# Patient Record
Sex: Male | Born: 1955 | Race: Black or African American | Hispanic: No | Marital: Married | Smoking: Former smoker
Health system: Southern US, Community
[De-identification: ages and names within clinical notes are randomized; demographics above are authoritative.]

## PROBLEM LIST (undated history)

## (undated) DIAGNOSIS — D689 Coagulation defect, unspecified: Secondary | ICD-10-CM

## (undated) DIAGNOSIS — N189 Chronic kidney disease, unspecified: Secondary | ICD-10-CM

## (undated) DIAGNOSIS — I809 Phlebitis and thrombophlebitis of unspecified site: Secondary | ICD-10-CM

## (undated) DIAGNOSIS — E785 Hyperlipidemia, unspecified: Secondary | ICD-10-CM

## (undated) DIAGNOSIS — K635 Polyp of colon: Secondary | ICD-10-CM

## (undated) DIAGNOSIS — Z86718 Personal history of other venous thrombosis and embolism: Secondary | ICD-10-CM

## (undated) DIAGNOSIS — F1010 Alcohol abuse, uncomplicated: Secondary | ICD-10-CM

## (undated) DIAGNOSIS — I10 Essential (primary) hypertension: Secondary | ICD-10-CM

## (undated) HISTORY — DX: Polyp of colon: K63.5

## (undated) HISTORY — DX: Hyperlipidemia, unspecified: E78.5

## (undated) HISTORY — DX: Phlebitis and thrombophlebitis of unspecified site: I80.9

## (undated) HISTORY — DX: Alcohol abuse, uncomplicated: F10.10

## (undated) HISTORY — DX: Personal history of other venous thrombosis and embolism: Z86.718

## (undated) HISTORY — DX: Chronic kidney disease, unspecified: N18.9

## (undated) HISTORY — DX: Coagulation defect, unspecified: D68.9

## (undated) HISTORY — DX: Essential (primary) hypertension: I10

---

## 2000-10-10 HISTORY — PX: TRACHEAL ESOPHAGEAL PUNCTURE REPAIR: SHX5218

## 2000-10-10 HISTORY — PX: LEG SURGERY: SHX1003

## 2001-07-18 ENCOUNTER — Encounter: Admission: RE | Admit: 2001-07-18 | Discharge: 2001-08-09 | Payer: Self-pay | Admitting: *Deleted

## 2015-09-01 ENCOUNTER — Ambulatory Visit (INDEPENDENT_AMBULATORY_CARE_PROVIDER_SITE_OTHER): Payer: No Typology Code available for payment source | Admitting: Internal Medicine

## 2015-09-01 ENCOUNTER — Encounter: Payer: Self-pay | Admitting: Internal Medicine

## 2015-09-01 ENCOUNTER — Ambulatory Visit: Payer: Self-pay | Admitting: Internal Medicine

## 2015-09-01 ENCOUNTER — Other Ambulatory Visit (INDEPENDENT_AMBULATORY_CARE_PROVIDER_SITE_OTHER): Payer: No Typology Code available for payment source

## 2015-09-01 VITALS — BP 134/80 | HR 85 | Temp 98.0°F | Resp 12 | Ht 70.0 in | Wt 198.1 lb

## 2015-09-01 DIAGNOSIS — I10 Essential (primary) hypertension: Secondary | ICD-10-CM

## 2015-09-01 DIAGNOSIS — M1A9XX0 Chronic gout, unspecified, without tophus (tophi): Secondary | ICD-10-CM

## 2015-09-01 DIAGNOSIS — Z23 Encounter for immunization: Secondary | ICD-10-CM | POA: Diagnosis not present

## 2015-09-01 DIAGNOSIS — E213 Hyperparathyroidism, unspecified: Secondary | ICD-10-CM

## 2015-09-01 DIAGNOSIS — E785 Hyperlipidemia, unspecified: Secondary | ICD-10-CM | POA: Insufficient documentation

## 2015-09-01 DIAGNOSIS — Z86718 Personal history of other venous thrombosis and embolism: Secondary | ICD-10-CM | POA: Insufficient documentation

## 2015-09-01 DIAGNOSIS — M792 Neuralgia and neuritis, unspecified: Secondary | ICD-10-CM

## 2015-09-01 LAB — COMPREHENSIVE METABOLIC PANEL
ALT: 11 U/L (ref 0–53)
AST: 21 U/L (ref 0–37)
Albumin: 4.3 g/dL (ref 3.5–5.2)
Alkaline Phosphatase: 107 U/L (ref 39–117)
BUN: 9 mg/dL (ref 6–23)
CO2: 29 mEq/L (ref 19–32)
Calcium: 9.5 mg/dL (ref 8.4–10.5)
Chloride: 103 mEq/L (ref 96–112)
Creatinine, Ser: 1.16 mg/dL (ref 0.40–1.50)
GFR: 68.45 mL/min (ref >60.00)
Glucose, Bld: 72 mg/dL (ref 70–99)
Potassium: 3.7 mEq/L (ref 3.5–5.1)
Sodium: 139 mEq/L (ref 135–145)
Total Bilirubin: 0.5 mg/dL (ref 0.2–1.2)
Total Protein: 8.7 g/dL — ABNORMAL HIGH (ref 6.0–8.3)

## 2015-09-01 LAB — URIC ACID: Uric Acid, Serum: 8.7 mg/dL — ABNORMAL HIGH (ref 4.0–7.8)

## 2015-09-01 NOTE — Assessment & Plan Note (Signed)
Reviewed recent lipid panel from 2/16 and does not need repeat today. Checking LFTs and adjust as needed. No symptoms on pravastatin 40 mg daily.
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
## 2015-09-01 NOTE — Patient Instructions (Signed)
We are going to check some blood work today to see if we think you need the calcitriol.   When you run out of the xarelto DO NOT refill it. We will stop it. To decrease your future risk of blood clots stop smoking, make sure to take breaks to get up and walk while on car rides.   You can stop taking the colchicine daily and keep it for when you have gout flares only.   Keep taking the amlodipine, allopurinol, gabapentin, duloxetine, pravastatin. Keep using tramadol as needed for pain.   Come back in about 6 months for a check up, if you have any problems or questions sooner please call us.   Smoking Cessation, Tips for Success If you are ready to quit smoking, congratulations! You have chosen to help yourself be healthier. Cigarettes bring nicotine, tar, carbon monoxide, and other irritants into your body. Your lungs, heart, and blood vessels will be able to work better without these poisons. There are many different ways to quit smoking. Nicotine gum, nicotine patches, a nicotine inhaler, or nicotine nasal spray can help with physical craving. Hypnosis, support groups, and medicines help break the habit of smoking. WHAT THINGS CAN I DO TO MAKE QUITTING EASIER?  Here are some tips to help you quit for good:  Pick a date when you will quit smoking completely. Tell all of your friends and family about your plan to quit on that date.  Do not try to slowly cut down on the number of cigarettes you are smoking. Pick a quit date and quit smoking completely starting on that day.  Throw away all cigarettes.   Clean and remove all ashtrays from your home, work, and car.  On a card, write down your reasons for quitting. Carry the card with you and read it when you get the urge to smoke.  Cleanse your body of nicotine. Drink enough water and fluids to keep your urine clear or pale yellow. Do this after quitting to flush the nicotine from your body.  Learn to predict your moods. Do not let a bad  situation be your excuse to have a cigarette. Some situations in your life might tempt you into wanting a cigarette.  Never have "just one" cigarette. It leads to wanting another and another. Remind yourself of your decision to quit.  Change habits associated with smoking. If you smoked while driving or when feeling stressed, try other activities to replace smoking. Stand up when drinking your coffee. Brush your teeth after eating. Sit in a different chair when you read the paper. Avoid alcohol while trying to quit, and try to drink fewer caffeinated beverages. Alcohol and caffeine may urge you to smoke.  Avoid foods and drinks that can trigger a desire to smoke, such as sugary or spicy foods and alcohol.  Ask people who smoke not to smoke around you.  Have something planned to do right after eating or having a cup of coffee. For example, plan to take a walk or exercise.  Try a relaxation exercise to calm you down and decrease your stress. Remember, you may be tense and nervous for the first 2 weeks after you quit, but this will pass.  Find new activities to keep your hands busy. Play with a pen, coin, or rubber band. Doodle or draw things on paper.  Brush your teeth right after eating. This will help cut down on the craving for the taste of tobacco after meals. You can also try mouthwash.  Use oral substitutes in place of cigarettes. Try using lemon drops, carrots, cinnamon sticks, or chewing gum. Keep them handy so they are available when you have the urge to smoke.  When you have the urge to smoke, try deep breathing.  Designate your home as a nonsmoking area.  If you are a heavy smoker, ask your health care provider about a prescription for nicotine chewing gum. It can ease your withdrawal from nicotine.  Reward yourself. Set aside the cigarette money you save and buy yourself something nice.  Look for support from others. Join a support group or smoking cessation program. Ask  someone at home or at work to help you with your plan to quit smoking.  Always ask yourself, "Do I need this cigarette or is this just a reflex?" Tell yourself, "Today, I choose not to smoke," or "I do not want to smoke." You are reminding yourself of your decision to quit.  Do not replace cigarette smoking with electronic cigarettes (commonly called e-cigarettes). The safety of e-cigarettes is unknown, and some may contain harmful chemicals.  If you relapse, do not give up! Plan ahead and think about what you will do the next time you get the urge to smoke. HOW WILL I FEEL WHEN I QUIT SMOKING? You may have symptoms of withdrawal because your body is used to nicotine (the addictive substance in cigarettes). You may crave cigarettes, be irritable, feel very hungry, cough often, get headaches, or have difficulty concentrating. The withdrawal symptoms are only temporary. They are strongest when you first quit but will go away within 10-14 days. When withdrawal symptoms occur, stay in control. Think about your reasons for quitting. Remind yourself that these are signs that your body is healing and getting used to being without cigarettes. Remember that withdrawal symptoms are easier to treat than the major diseases that smoking can cause.  Even after the withdrawal is over, expect periodic urges to smoke. However, these cravings are generally short lived and will go away whether you smoke or not. Do not smoke! WHAT RESOURCES ARE AVAILABLE TO HELP ME QUIT SMOKING? Your health care provider can direct you to community resources or hospitals for support, which may include:  Group support.  Education.  Hypnosis.  Therapy.   This information is not intended to replace advice given to you by your health care provider. Make sure you discuss any questions you have with your health care provider.   Document Released: 06/24/2004 Document Revised: 10/17/2014 Document Reviewed: 03/14/2013 Elsevier  Interactive Patient Education [employer]

## 2015-09-01 NOTE — Progress Notes (Signed)
Pre visit review using our clinic review tool, if applicable. No additional management support is needed unless otherwise documented below in the visit note. 
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
## 2015-09-01 NOTE — Assessment & Plan Note (Signed)
Has been on 9 months of therapy for first provoked DVT. Talked to him about the need to stop smoking. Okay to stop xarelto once bottle is done. Reminded them about the risk of future blood clot. If he does have another DVT then he would need lifelong anticoagulation. Unlikely to be primary clotting disorder as no family history.

## 2015-09-01 NOTE — Assessment & Plan Note (Signed)
BP at goal on amlodipine, checking CMP today and adjust as needed. Reviewing his records during visit he has had renal insufficiency in the past with Cr up to 1.7 and unclear if this is why he is on calcitriol or primary hyperparathyroidism.

## 2015-09-01 NOTE — Assessment & Plan Note (Signed)
Will need to review records about this. Checking PTH and calcium today. It is unclear if this was caused by his renal insufficiency or primary. If primary then he needs bone density if he has not had.

## 2015-09-03 LAB — PTH, INTACT AND CALCIUM
Calcium: 9.1 mg/dL (ref 8.4–10.5)
PTH: 11 pg/mL — ABNORMAL LOW (ref 14–64)

## 2015-09-07 ENCOUNTER — Other Ambulatory Visit: Payer: Self-pay | Admitting: Internal Medicine

## 2015-09-07 MED ORDER — ALLOPURINOL 300 MG PO TABS: 300.0000 mg | ORAL_TABLET | Freq: Two times a day (BID) | ORAL | Status: DC

## 2015-11-09 ENCOUNTER — Encounter: Payer: Self-pay | Admitting: Internal Medicine

## 2015-11-09 ENCOUNTER — Other Ambulatory Visit (INDEPENDENT_AMBULATORY_CARE_PROVIDER_SITE_OTHER): Payer: BLUE CROSS/BLUE SHIELD

## 2015-11-09 ENCOUNTER — Ambulatory Visit (INDEPENDENT_AMBULATORY_CARE_PROVIDER_SITE_OTHER): Payer: BLUE CROSS/BLUE SHIELD | Admitting: Internal Medicine

## 2015-11-09 VITALS — BP 130/80 | HR 87 | Temp 97.9°F | Ht 70.0 in | Wt 191.5 lb

## 2015-11-09 DIAGNOSIS — F17210 Nicotine dependence, cigarettes, uncomplicated: Secondary | ICD-10-CM | POA: Insufficient documentation

## 2015-11-09 DIAGNOSIS — M109 Gout, unspecified: Secondary | ICD-10-CM | POA: Diagnosis not present

## 2015-11-09 DIAGNOSIS — E213 Hyperparathyroidism, unspecified: Secondary | ICD-10-CM | POA: Diagnosis not present

## 2015-11-09 DIAGNOSIS — M792 Neuralgia and neuritis, unspecified: Secondary | ICD-10-CM

## 2015-11-09 DIAGNOSIS — Z720 Tobacco use: Secondary | ICD-10-CM

## 2015-11-09 LAB — URIC ACID: Uric Acid, Serum: 4.1 mg/dL (ref 4.0–7.8)

## 2015-11-09 MED ORDER — TRAMADOL HCL 50 MG PO TABS: 50.0000 mg | ORAL_TABLET | Freq: Three times a day (TID) | ORAL | Status: DC

## 2015-11-09 MED ORDER — PRAVASTATIN SODIUM 40 MG PO TABS: 40.0000 mg | ORAL_TABLET | Freq: Every day | ORAL | Status: DC

## 2015-11-09 MED ORDER — AMLODIPINE BESYLATE 10 MG PO TABS: 10.0000 mg | ORAL_TABLET | Freq: Every day | ORAL | Status: DC

## 2015-11-09 NOTE — Progress Notes (Signed)
   Subjective:    Patient ID: Troy Bird, male    DOB: 1956-02-06, 60 y.o.   MRN: [id number]  HPI The patient is a 60 YO man coming in for follow up of his gout. He stopped taking calcitriol per our advice and has noticed no changes. He is having more arthritis pains from the cold weather which is hurting his joints and making them more stiff. He is also taking the allopurinol twice a day without flare. Stopped taking colchicine outside of flare. No flare since last time. Did also stop the xarelto per our advice last visit. No leg swelling or pain in his chest. Has cut way back on smoking.   Review of Systems  Constitutional: Negative for fever, activity change, appetite change, fatigue and unexpected weight change.  HENT: Negative.   Eyes: Negative.   Respiratory: Negative for cough, chest tightness, shortness of breath and wheezing.   Cardiovascular: Negative for chest pain, palpitations and leg swelling.  Gastrointestinal: Negative for nausea, abdominal pain, diarrhea, constipation and abdominal distention.  Musculoskeletal: Positive for arthralgias and gait problem. Negative for myalgias.  Skin: Negative.   Neurological: Negative.   Psychiatric/Behavioral: Negative.       Objective:   Physical Exam  Constitutional: He is oriented to person, place, and time. He appears well-developed and well-nourished.  HENT:  Head: Normocephalic and atraumatic.  Eyes: EOM are normal.  Neck: Normal range of motion.  Cardiovascular: Normal rate and regular rhythm.   Pulmonary/Chest: Effort normal and breath sounds normal. No respiratory distress. He has no wheezes. He has no rales.  Abdominal: Soft. Bowel sounds are normal. He exhibits no distension. There is no tenderness. There is no rebound.  Musculoskeletal: He exhibits no edema.  Right leg does not bend at the knee  Neurological: He is alert and oriented to person, place, and time. Coordination normal.  Skin: Skin is warm and dry.    Psychiatric: He has a normal mood and affect.   Filed Vitals:   11/09/15 0831  BP: 130/80  Pulse: 87  Temp: 97.9 F (36.6 C)  TempSrc: Oral  Height:  (1.778 m)  Weight: 191 lb 8 oz (86.864 kg)  SpO2: 97%      Assessment & Plan:

## 2015-11-09 NOTE — Assessment & Plan Note (Signed)
Suspect this is not an accurate diagnosis. Have stopped calcitriol and rechecking PTH and calcium levels today. If still normal will remove from list.

## 2015-11-09 NOTE — Patient Instructions (Signed)
We have filled out the papers and given you the prescription for the pain medicine.   We are checking your labs today and will call with the results.

## 2015-11-09 NOTE — Assessment & Plan Note (Signed)
Per our advice he has cut back to less than 1/2 pack per day. Previously smoking much more. He will keep cutting back with the goal to quit. Reminded about the risk and harm from cigarette smoke.

## 2015-11-09 NOTE — Progress Notes (Signed)
Pre visit review using our clinic review tool, if applicable. No additional management support is needed unless otherwise documented below in the visit note. 
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
## 2015-11-09 NOTE — Assessment & Plan Note (Signed)
Increased allopurinol to 300 mg BID at last visit. Rechecking uric acid for goal <6. No tophus and no flare since last time. Not taking colchicine today anymore.

## 2015-11-10 LAB — PTH, INTACT AND CALCIUM
Calcium: 7.9 mg/dL — ABNORMAL LOW (ref 8.4–10.5)
PTH: 27 pg/mL (ref 14–64)

## 2016-02-23 ENCOUNTER — Ambulatory Visit: Payer: No Typology Code available for payment source | Admitting: Internal Medicine

## 2016-02-29 ENCOUNTER — Ambulatory Visit (INDEPENDENT_AMBULATORY_CARE_PROVIDER_SITE_OTHER): Payer: BLUE CROSS/BLUE SHIELD | Admitting: Internal Medicine

## 2016-02-29 ENCOUNTER — Encounter: Payer: Self-pay | Admitting: Internal Medicine

## 2016-02-29 VITALS — BP 150/88 | HR 77 | Temp 98.4°F | Resp 18 | Ht 71.0 in | Wt 182.4 lb

## 2016-02-29 DIAGNOSIS — M792 Neuralgia and neuritis, unspecified: Secondary | ICD-10-CM | POA: Diagnosis not present

## 2016-02-29 DIAGNOSIS — I10 Essential (primary) hypertension: Secondary | ICD-10-CM | POA: Diagnosis not present

## 2016-02-29 MED ORDER — DICLOFENAC SODIUM 1 % TD GEL: 2.0000 g | Freq: Four times a day (QID) | TRANSDERMAL | Status: DC

## 2016-02-29 MED ORDER — TRAMADOL HCL 50 MG PO TABS: 50.0000 mg | ORAL_TABLET | Freq: Three times a day (TID) | ORAL | Status: DC

## 2016-02-29 NOTE — Assessment & Plan Note (Signed)
Rx for voltaren and increase tramadol to 120 per month as needed for pain. Encouraged only to use extra when needed.

## 2016-02-29 NOTE — Assessment & Plan Note (Signed)
BP mildly above goal on amlodipine 10 mg daily which he has not taken today yet. Monitor and adjust next visit if needed as his BP is generally at goal. Last BMP without indication for change.

## 2016-02-29 NOTE — Progress Notes (Signed)
Pre visit review using our clinic review tool, if applicable. No additional management support is needed unless otherwise documented below in the visit note. 
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
## 2016-02-29 NOTE — Patient Instructions (Signed)
We have increased the amount of the tramadol so you can take 1 or 2 pills up to 3 times per day for the pain as you need it.   We have sent the refill to your pharmacy.   You are doing great with the weight and I would recommend to stay around this weight.   Keep up the good work with walking and being active.

## 2016-06-22 ENCOUNTER — Encounter: Payer: Self-pay | Admitting: Internal Medicine

## 2016-06-22 ENCOUNTER — Ambulatory Visit (INDEPENDENT_AMBULATORY_CARE_PROVIDER_SITE_OTHER): Payer: BLUE CROSS/BLUE SHIELD | Admitting: Internal Medicine

## 2016-06-22 DIAGNOSIS — M545 Low back pain, unspecified: Secondary | ICD-10-CM | POA: Insufficient documentation

## 2016-06-22 DIAGNOSIS — M5441 Lumbago with sciatica, right side: Secondary | ICD-10-CM | POA: Diagnosis not present

## 2016-06-22 MED ORDER — TRAMADOL HCL 50 MG PO TABS: 50.0000 mg | ORAL_TABLET | Freq: Three times a day (TID) | ORAL | 1 refills | Status: DC

## 2016-06-22 MED ORDER — DICLOFENAC SODIUM 75 MG PO TBEC: 75.0000 mg | DELAYED_RELEASE_TABLET | Freq: Two times a day (BID) | ORAL | 0 refills | Status: DC
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
## 2016-06-22 NOTE — Patient Instructions (Signed)
We have refilled the tramadol and sent it in.  We have also sent in voltaren pills that should help the back and the elbow. Take 1 pill twice a day for the next 1-2 weeks for the pain.   You can use heat or ice if they help with the pain.   Back Injury Prevention Back injuries can be very painful. They can also be difficult to heal. After having one back injury, you are more likely to injure your back again. It is important to learn how to avoid injuring or re-injuring your back. The following tips can help you to prevent a back injury. WHAT SHOULD I KNOW ABOUT PHYSICAL FITNESS?  Exercise for 30 minutes per day on most days of the week or as directed by your health care provider. Make sure to:  Do aerobic exercises, such as walking, jogging, biking, or swimming.  Do exercises that increase balance and strength, such as tai chi and yoga. These can decrease your risk of falling and injuring your back.  Do stretching exercises to help with flexibility.  Try to develop strong abdominal muscles. Your abdominal muscles provide a lot of the support that is needed by your back.  Maintain a healthy weight. This helps to decrease your risk of a back injury. WHAT SHOULD I KNOW ABOUT MY DIET?  Talk with your health care provider about your overall diet. Take supplements and vitamins only as directed by your health care provider.  Talk with your health care provider about how much calcium and vitamin D you need each day. These nutrients help to prevent weakening of the bones (osteoporosis). Osteoporosis can cause broken (fractured) bones, which lead to back pain.  Include good sources of calcium in your diet, such as dairy products, green leafy vegetables, and products that have had calcium added to them (fortified).  Include good sources of vitamin D in your diet, such as milk and foods that are fortified with vitamin D. WHAT SHOULD I KNOW ABOUT MY POSTURE?  Sit up straight and stand up  straight. Avoid leaning forward when you sit or hunching over when you stand.  Choose chairs that have good low-back (lumbar) support.  If you work at a desk, sit close to it so you do not need to lean over. Keep your chin tucked in. Keep your neck drawn back, and keep your elbows bent at a right angle. Your arms should look like the letter "L."  Sit high and close to the steering wheel when you drive. Add a lumbar support to your car seat, if needed.  Avoid sitting or standing in one position for very long. Take breaks to get up, stretch, and walk around at least one time every hour. Take breaks every hour if you are driving for long periods of time.  Sleep on your side with your knees slightly bent, or sleep on your back with a pillow under your knees. Do not lie on the front of your body to sleep. WHAT SHOULD I KNOW ABOUT LIFTING, TWISTING, AND REACHING? Lifting and Heavy Lifting  Avoid heavy lifting, especially repetitive heavy lifting. If you must do heavy lifting:  Stretch before lifting.  Work slowly.  Rest between lifts.  Use a tool such as a cart or a dolly to move objects if one is available.  Make several small trips instead of carrying one heavy load.  Ask for help when you need it, especially when moving big objects.  Follow these steps when lifting:  Stand with your feet shoulder-width apart.  Get as close to the object as you can. Do not try to pick up a heavy object that is far from your body.  Use handles or lifting straps if they are available.  Bend at your knees. Squat down, but keep your heels off the floor.  Keep your shoulders pulled back, your chin tucked in, and your back straight.  Lift the object slowly while you tighten the muscles in your legs, abdomen, and buttocks. Keep the object as close to the center of your body as possible.  Follow these steps when putting down a heavy load:  Stand with your feet shoulder-width apart.  Lower the object  slowly while you tighten the muscles in your legs, abdomen, and buttocks. Keep the object as close to the center of your body as possible.  Keep your shoulders pulled back, your chin tucked in, and your back straight.  Bend at your knees. Squat down, but keep your heels off the floor.  Use handles or lifting straps if they are available. Twisting and Reaching  Avoid lifting heavy objects above your waist.  Do not twist at your waist while you are lifting or carrying a load. If you need to turn, move your feet.  Do not bend over without bending at your knees.  Avoid reaching over your head, across a table, or for an object on a high surface. WHAT ARE SOME OTHER TIPS?  Avoid wet floors and icy ground. Keep sidewalks clear of ice to prevent falls.  Do not sleep on a mattress that is too soft or too hard.  Keep items that are used frequently within easy reach.  Put heavier objects on shelves at waist level, and put lighter objects on lower or higher shelves.  Find ways to decrease your stress, such as exercise, massage, or relaxation techniques. Stress can build up in your muscles. Tense muscles are more vulnerable to injury.  Talk with your health care provider if you feel anxious or depressed. These conditions can make back pain worse.  Wear flat heel shoes with cushioned soles.  Avoid sudden movements.  Use both shoulder straps when carrying a backpack.  Do not use any tobacco products, including cigarettes, chewing tobacco, or electronic cigarettes. If you need help quitting, ask your health care provider.   This information is not intended to replace advice given to you by your health care provider. Make sure you discuss any questions you have with your health care provider.   Document Released: 11/03/2004 Document Revised: 02/10/2015 Document Reviewed: 09/30/2014 Elsevier Interactive Patient Education [employer].

## 2016-06-22 NOTE — Assessment & Plan Note (Signed)
Rx for voltaren pills for 1-2 weeks for the pain. He will continue to use his tramadol as needed as well and heat. Given information about safe lifting techniques and stretches to avoid injury in the future. No indication for imaging today.

## 2016-06-22 NOTE — Progress Notes (Signed)
Pre visit review using our clinic review tool, if applicable. No additional management support is needed unless otherwise documented below in the visit note. 

## 2016-07-30 ENCOUNTER — Other Ambulatory Visit: Payer: Self-pay | Admitting: Internal Medicine

## 2016-08-01 ENCOUNTER — Other Ambulatory Visit: Payer: Self-pay | Admitting: *Deleted

## 2016-08-01 MED ORDER — AMLODIPINE BESYLATE 10 MG PO TABS: 10.0000 mg | ORAL_TABLET | Freq: Every day | ORAL | 0 refills | Status: DC

## 2016-08-01 MED ORDER — DICLOFENAC SODIUM 75 MG PO TBEC: 75.0000 mg | DELAYED_RELEASE_TABLET | Freq: Two times a day (BID) | ORAL | 0 refills | Status: DC

## 2016-08-01 NOTE — Addendum Note (Signed)
Addended by: Deatra JamesBRAND, Beyounce Dickens M on: 08/01/2016 09:31 AM   Modules accepted: Orders

## 2016-08-02 ENCOUNTER — Other Ambulatory Visit: Payer: Self-pay | Admitting: Geriatric Medicine

## 2016-08-02 MED ORDER — AMLODIPINE BESYLATE 10 MG PO TABS: 10.0000 mg | ORAL_TABLET | Freq: Every day | ORAL | 0 refills | Status: DC

## 2016-08-30 ENCOUNTER — Encounter: Payer: BLUE CROSS/BLUE SHIELD | Admitting: Internal Medicine

## 2016-10-04 ENCOUNTER — Ambulatory Visit (INDEPENDENT_AMBULATORY_CARE_PROVIDER_SITE_OTHER): Payer: BLUE CROSS/BLUE SHIELD | Admitting: Internal Medicine

## 2016-10-04 ENCOUNTER — Encounter: Payer: Self-pay | Admitting: Internal Medicine

## 2016-10-04 ENCOUNTER — Other Ambulatory Visit (INDEPENDENT_AMBULATORY_CARE_PROVIDER_SITE_OTHER): Payer: BLUE CROSS/BLUE SHIELD

## 2016-10-04 VITALS — BP 130/80 | HR 81 | Temp 98.5°F | Ht 71.0 in | Wt 192.5 lb

## 2016-10-04 DIAGNOSIS — M1A9XX0 Chronic gout, unspecified, without tophus (tophi): Secondary | ICD-10-CM

## 2016-10-04 DIAGNOSIS — Z0000 Encounter for general adult medical examination without abnormal findings: Secondary | ICD-10-CM | POA: Insufficient documentation

## 2016-10-04 DIAGNOSIS — M79604 Pain in right leg: Secondary | ICD-10-CM

## 2016-10-04 DIAGNOSIS — M792 Neuralgia and neuritis, unspecified: Secondary | ICD-10-CM

## 2016-10-04 DIAGNOSIS — I10 Essential (primary) hypertension: Secondary | ICD-10-CM | POA: Diagnosis not present

## 2016-10-04 DIAGNOSIS — E785 Hyperlipidemia, unspecified: Secondary | ICD-10-CM

## 2016-10-04 DIAGNOSIS — F17210 Nicotine dependence, cigarettes, uncomplicated: Secondary | ICD-10-CM

## 2016-10-04 LAB — COMPREHENSIVE METABOLIC PANEL
ALT: 14 U/L (ref 0–53)
AST: 25 U/L (ref 0–37)
Albumin: 4.2 g/dL (ref 3.5–5.2)
Alkaline Phosphatase: 129 U/L — ABNORMAL HIGH (ref 39–117)
BUN: 12 mg/dL (ref 6–23)
CO2: 29 mEq/L (ref 19–32)
Calcium: 6.9 mg/dL — ABNORMAL LOW (ref 8.4–10.5)
Chloride: 104 mEq/L (ref 96–112)
Creatinine, Ser: 1.21 mg/dL (ref 0.40–1.50)
GFR: 64.95 mL/min (ref >60.00)
Glucose, Bld: 74 mg/dL (ref 70–99)
Potassium: 3.8 mEq/L (ref 3.5–5.1)
Sodium: 141 mEq/L (ref 135–145)
Total Bilirubin: 0.4 mg/dL (ref 0.2–1.2)
Total Protein: 8.2 g/dL (ref 6.0–8.3)

## 2016-10-04 LAB — CBC
HCT: 41.3 % (ref 39.0–52.0)
Hemoglobin: 14.2 g/dL (ref 13.0–17.0)
MCHC: 34.4 g/dL (ref 30.0–36.0)
MCV: 86.2 fl (ref 78.0–100.0)
Platelets: 287 10*3/uL (ref 150.0–400.0)
RBC: 4.79 Mil/uL (ref 4.22–5.81)
RDW: 15.7 % — ABNORMAL HIGH (ref 11.5–15.5)
WBC: 7.6 10*3/uL (ref 4.0–10.5)

## 2016-10-04 LAB — LIPID PANEL
Cholesterol: 182 mg/dL (ref 0–200)
HDL: 42.2 mg/dL (ref >39.00)
LDL Cholesterol: 129 mg/dL — ABNORMAL HIGH (ref 0–99)
NonHDL: 140.15
Total CHOL/HDL Ratio: 4
Triglycerides: 56 mg/dL (ref 0.0–149.0)
VLDL: 11.2 mg/dL (ref 0.0–40.0)

## 2016-10-04 MED ORDER — TRAMADOL HCL 50 MG PO TABS: 50.0000 mg | ORAL_TABLET | Freq: Three times a day (TID) | ORAL | 1 refills | Status: DC

## 2016-10-04 MED ORDER — ALLOPURINOL 300 MG PO TABS: 300.0000 mg | ORAL_TABLET | Freq: Two times a day (BID) | ORAL | 3 refills | Status: DC

## 2016-10-04 MED ORDER — PRAVASTATIN SODIUM 40 MG PO TABS: 40.0000 mg | ORAL_TABLET | Freq: Every day | ORAL | 3 refills | Status: DC

## 2016-10-04 MED ORDER — AMLODIPINE BESYLATE 10 MG PO TABS: 10.0000 mg | ORAL_TABLET | Freq: Every day | ORAL | 3 refills | Status: DC

## 2016-10-04 MED ORDER — DICLOFENAC SODIUM 75 MG PO TBEC: 75.0000 mg | DELAYED_RELEASE_TABLET | Freq: Two times a day (BID) | ORAL | 3 refills | Status: DC

## 2016-10-04 MED ORDER — DICLOFENAC SODIUM 1 % TD GEL: 2.0000 g | Freq: Four times a day (QID) | TRANSDERMAL | 3 refills | Status: DC

## 2016-10-04 NOTE — Assessment & Plan Note (Signed)
BP at goal and amlodipine renewed.

## 2016-10-04 NOTE — Assessment & Plan Note (Signed)
Off allopurinol for a few days and joint pains worse. Refilled this today.

## 2016-10-04 NOTE — Progress Notes (Signed)
   Subjective:    Patient ID: Troy Bird, male    DOB: [birth year], 60 y.o.   MRN: [id number]  HPI The patient is a 60 YO man coming in for wellness. No new concerns.  [facility] reviewed and updated.   Review of Systems  Constitutional: Negative.   HENT: Positive for hearing loss.   Eyes: Negative.   Respiratory: Negative for cough, chest tightness and shortness of breath.   Cardiovascular: Negative for chest pain, palpitations and leg swelling.  Gastrointestinal: Negative for abdominal distention, abdominal pain, constipation, diarrhea, nausea and vomiting.  Musculoskeletal: Positive for arthralgias and myalgias. Negative for back pain and gait problem.  Skin: Negative.   Neurological: Negative.   Psychiatric/Behavioral: Negative.       Objective:   Physical Exam  Constitutional: He is oriented to person, place, and time. He appears well-developed and well-nourished.  HENT:  Head: Normocephalic and atraumatic.  Eyes: EOM are normal.  Neck: Normal range of motion.  Cardiovascular: Normal rate and regular rhythm.   Pulmonary/Chest: Effort normal and breath sounds normal. No respiratory distress. He has no wheezes. He has no rales.  Abdominal: Soft. Bowel sounds are normal. He exhibits no distension. There is no tenderness. There is no rebound.  Musculoskeletal: He exhibits no edema.  Neurological: He is alert and oriented to person, place, and time. Coordination normal.  Skin: Skin is warm and dry.  Psychiatric: He has a normal mood and affect.   Vitals:   10/04/16 0801  BP: 130/80  Pulse: 81  Temp: 98.5 F (36.9 C)  TempSrc: Oral  SpO2: 97%  Weight: 192 lb 8 oz (87.3 kg)  Height: 5\' 11"  (1.803 m)      Assessment & Plan:

## 2016-10-04 NOTE — Assessment & Plan Note (Signed)
He has cut back since last visit and is still working on quitting. Counseled again about the risks and harms from cigarette smoking and advised to quit.

## 2016-10-04 NOTE — Assessment & Plan Note (Signed)
Has had colonoscopy before but cannot remember the year. Flu shot up to date. He thinks the tetanus is up to date and declines another today. Declines hep c screening today. Counseled on exercise and dangers of distracted driving. Given screening recommendations.

## 2016-10-04 NOTE — Progress Notes (Signed)
Pre visit review using our clinic review tool, if applicable. No additional management support is needed unless otherwise documented below in the visit note. 

## 2016-10-04 NOTE — Patient Instructions (Signed)
We are checking the blood work today and will get you in with the orthopedic doctor to check on the leg.   Get back into the walking as this will help keep the joints better over time.  Health Maintenance, Male A healthy lifestyle and preventative care can promote health and wellness.  Maintain regular health, dental, and eye exams.  Eat a healthy diet. Foods like vegetables, fruits, whole grains, low-fat dairy products, and lean protein foods contain the nutrients you need and are low in calories. Decrease your intake of foods high in solid fats, added sugars, and salt. Get information about a proper diet from your health care provider, if necessary.  Regular physical exercise is one of the most important things you can do for your health. Most adults should get at least 150 minutes of moderate-intensity exercise (any activity that increases your heart rate and causes you to sweat) each week. In addition, most adults need muscle-strengthening exercises on 2 or more days a week.   Maintain a healthy weight. The body mass index (BMI) is a screening tool to identify possible weight problems. It provides an estimate of body fat based on height and weight. Your health care provider can find your BMI and can help you achieve or maintain a healthy weight. For males 20 years and older:  A BMI below 18.5 is considered underweight.  A BMI of 18.5 to 24.9 is normal.  A BMI of 25 to 29.9 is considered overweight.  A BMI of 30 and above is considered obese.  Maintain normal blood lipids and cholesterol by exercising and minimizing your intake of saturated fat. Eat a balanced diet with plenty of fruits and vegetables. Blood tests for lipids and cholesterol should begin at age 60 and be repeated every 5 years. If your lipid or cholesterol levels are high, you are over age 60, or you are at high risk for heart disease, you may need your cholesterol levels checked more frequently.Ongoing high lipid and  cholesterol levels should be treated with medicines if diet and exercise are not working.  If you smoke, find out from your health care provider how to quit. If you do not use tobacco, do not start.  Lung cancer screening is recommended for adults aged 55-80 years who are at high risk for developing lung cancer because of a history of smoking. A yearly low-dose CT scan of the lungs is recommended for people who have at least a 30-pack-year history of smoking and are current smokers or have quit within the past 15 years. A pack year of smoking is smoking an average of 1 pack of cigarettes a day for 1 year (for example, a 30-pack-year history of smoking could mean smoking 1 pack a day for 30 years or 2 packs a day for 15 years). Yearly screening should continue until the smoker has stopped smoking for at least 15 years. Yearly screening should be stopped for people who develop a health problem that would prevent them from having lung cancer treatment.  If you choose to drink alcohol, do not have more than 2 drinks per day. One drink is considered to be 12 oz (360 mL) of beer, 5 oz (150 mL) of wine, or 1.5 oz (45 mL) of liquor.  Avoid the use of street drugs. Do not share needles with anyone. Ask for help if you need support or instructions about stopping the use of drugs.  High blood pressure causes heart disease and increases the risk of stroke.  High blood pressure is more likely to develop in:  People who have blood pressure in the end of the normal range (100-139/85-89 mm Hg).  People who are overweight or obese.  People who are African American.  If you are 1018-60 years of age, have your blood pressure checked every 3-5 years. If you are 60 years of age or older, have your blood pressure checked every year. You should have your blood pressure measured twice-once when you are at a hospital or clinic, and once when you are not at a hospital or clinic. Record the average of the two measurements. To  check your blood pressure when you are not at a hospital or clinic, you can use:  An automated blood pressure machine at a pharmacy.  A home blood pressure monitor.  If you are 5045-60 years old, ask your health care provider if you should take aspirin to prevent heart disease.  Diabetes screening involves taking a blood sample to check your fasting blood sugar level. This should be done once every 3 years after age 60 if you are at a normal weight and without risk factors for diabetes. Testing should be considered at a younger age or be carried out more frequently if you are overweight and have at least 1 risk factor for diabetes.  Colorectal cancer can be detected and often prevented. Most routine colorectal cancer screening begins at the age of 60 and continues through age 275. However, your health care provider may recommend screening at an earlier age if you have risk factors for colon cancer. On a yearly basis, your health care provider may provide home test kits to check for hidden blood in the stool. A small camera at the end of a tube may be used to directly examine the colon (sigmoidoscopy or colonoscopy) to detect the earliest forms of colorectal cancer. Talk to your health care provider about this at age 60 when routine screening begins. A direct exam of the colon should be repeated every 5-10 years through age 60, unless early forms of precancerous polyps or small growths are found.  People who are at an increased risk for hepatitis B should be screened for this virus. You are considered at high risk for hepatitis B if:  You were born in a country where hepatitis B occurs often. Talk with your health care provider about which countries are considered high risk.  Your parents were born in a high-risk country and you have not received a shot to protect against hepatitis B (hepatitis B vaccine).  You have HIV or AIDS.  You use needles to inject street drugs.  You live with, or have sex  with, someone who has hepatitis B.  You are a man who has sex with other men (MSM).  You get hemodialysis treatment.  You take certain medicines for conditions like cancer, organ transplantation, and autoimmune conditions.  Hepatitis C blood testing is recommended for all people born from [id number] through 1965 and any individual with known risk factors for hepatitis C.  Healthy men should no longer receive prostate-specific antigen (PSA) blood tests as part of routine cancer screening. Talk to your health care provider about prostate cancer screening.  Testicular cancer screening is not recommended for adolescents or adult males who have no symptoms. Screening includes self-exam, a health care provider exam, and other screening tests. Consult with your health care provider about any symptoms you have or any concerns you have about testicular cancer.  Practice safe sex. Use condoms  and avoid high-risk sexual practices to reduce the spread of sexually transmitted infections (STIs).  You should be screened for STIs, including gonorrhea and chlamydia if:  You are sexually active and are younger than 24 years.  You are older than 24 years, and your health care provider tells you that you are at risk for this type of infection.  Your sexual activity has changed since you were last screened, and you are at an increased risk for chlamydia or gonorrhea. Ask your health care provider if you are at risk.  If you are at risk of being infected with HIV, it is recommended that you take a prescription medicine daily to prevent HIV infection. This is called pre-exposure prophylaxis (PrEP). You are considered at risk if:  You are a man who has sex with other men (MSM).  You are a heterosexual man who is sexually active with multiple partners.  You take drugs by injection.  You are sexually active with a partner who has HIV.  Talk with your health care provider about whether you are at high risk of being  infected with HIV. If you choose to begin PrEP, you should first be tested for HIV. You should then be tested every 3 months for as long as you are taking PrEP.  Use sunscreen. Apply sunscreen liberally and repeatedly throughout the day. You should seek shade when your shadow is shorter than you. Protect yourself by wearing long sleeves, pants, a wide-brimmed hat, and sunglasses year round whenever you are outdoors.  Tell your health care provider of new moles or changes in moles, especially if there is a change in shape or color. Also, tell your health care provider if a mole is larger than the size of a pencil eraser.  A one-time screening for abdominal aortic aneurysm (AAA) and surgical repair of large AAAs by ultrasound is recommended for men aged 65-75 years who are current or former smokers.  Stay current with your vaccines (immunizations). This information is not intended to replace advice given to you by your health care provider. Make sure you discuss any questions you have with your health care provider. Document Released: 03/24/2008 Document Revised: 10/17/2014 Document Reviewed: 06/30/2015 Elsevier Interactive Patient Education  2017 [employer]
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
## 2016-10-04 NOTE — Assessment & Plan Note (Signed)
Checking lipid panel, on pravastatin daily without side effects.

## 2016-10-12 DIAGNOSIS — I8311 Varicose veins of right lower extremity with inflammation: Secondary | ICD-10-CM | POA: Diagnosis not present

## 2016-10-12 DIAGNOSIS — M25571 Pain in right ankle and joints of right foot: Secondary | ICD-10-CM | POA: Diagnosis not present

## 2016-10-12 DIAGNOSIS — L300 Nummular dermatitis: Secondary | ICD-10-CM | POA: Diagnosis not present

## 2016-10-12 DIAGNOSIS — M545 Low back pain: Secondary | ICD-10-CM | POA: Diagnosis not present

## 2016-10-12 DIAGNOSIS — I8391 Asymptomatic varicose veins of right lower extremity: Secondary | ICD-10-CM | POA: Diagnosis not present

## 2016-10-12 DIAGNOSIS — D2272 Melanocytic nevi of left lower limb, including hip: Secondary | ICD-10-CM | POA: Diagnosis not present

## 2016-10-21 ENCOUNTER — Telehealth: Payer: Self-pay | Admitting: Emergency Medicine

## 2016-10-21 NOTE — Telephone Encounter (Signed)
[facility] called and needs a prior authorization for his prescription diclofenac sodium (VOLTAREN) 1 % GEL. Please advise thanks.

## 2016-10-24 NOTE — Telephone Encounter (Signed)
Contacted [facility] to get pa sent to us to start

## 2016-10-25 DIAGNOSIS — I8312 Varicose veins of left lower extremity with inflammation: Secondary | ICD-10-CM | POA: Diagnosis not present

## 2016-10-25 DIAGNOSIS — I872 Venous insufficiency (chronic) (peripheral): Secondary | ICD-10-CM | POA: Diagnosis not present

## 2016-10-25 DIAGNOSIS — L300 Nummular dermatitis: Secondary | ICD-10-CM | POA: Diagnosis not present

## 2016-10-25 DIAGNOSIS — I8311 Varicose veins of right lower extremity with inflammation: Secondary | ICD-10-CM | POA: Diagnosis not present

## 2016-11-02 ENCOUNTER — Telehealth: Payer: Self-pay | Admitting: Geriatric Medicine

## 2016-11-02 NOTE — Telephone Encounter (Signed)
PA for diclofenac Gel has been initiated.

## 2017-04-04 ENCOUNTER — Ambulatory Visit: Payer: BLUE CROSS/BLUE SHIELD | Admitting: Internal Medicine

## 2017-04-04 ENCOUNTER — Ambulatory Visit (INDEPENDENT_AMBULATORY_CARE_PROVIDER_SITE_OTHER): Payer: BLUE CROSS/BLUE SHIELD | Admitting: Nurse Practitioner

## 2017-04-04 ENCOUNTER — Encounter: Payer: Self-pay | Admitting: Nurse Practitioner

## 2017-04-04 VITALS — BP 118/70 | HR 75 | Temp 98.0°F | Ht 71.0 in | Wt 178.0 lb

## 2017-04-04 DIAGNOSIS — I10 Essential (primary) hypertension: Secondary | ICD-10-CM | POA: Diagnosis not present

## 2017-04-04 DIAGNOSIS — M792 Neuralgia and neuritis, unspecified: Secondary | ICD-10-CM | POA: Diagnosis not present

## 2017-04-04 MED ORDER — GABAPENTIN 100 MG PO CAPS: 100.0000 mg | ORAL_CAPSULE | Freq: Two times a day (BID) | ORAL | 1 refills | Status: DC

## 2017-04-04 MED ORDER — TRAMADOL HCL 50 MG PO TABS: 50.0000 mg | ORAL_TABLET | Freq: Three times a day (TID) | ORAL | 0 refills | Status: DC

## 2017-04-04 NOTE — Progress Notes (Signed)
Subjective:  Patient ID: Troy Bird, male    DOB: 18-Jan-1956  Age: 61 y.o. MRN: [id number]  CC: Follow-up (6 mo fu--right leg burning--taking tramadol for this (req refill))   HPI  Gout: No exacerbation since last OV.  HTN: Controlled with amlodipine.  Chronic right leg pain: Burning sensation, intermittent. Used lyrica 150mg  in past, stopped taking due to swelling, depression and altered mental status.  Outpatient Medications Prior to Visit  Medication Sig Dispense Refill  . allopurinol (ZYLOPRIM) 300 MG tablet Take 1 tablet (300 mg total) by mouth 2 (two) times daily. 180 tablet 3  . amLODipine (NORVASC) 10 MG tablet Take 1 tablet (10 mg total) by mouth daily. 90 tablet 3  . Colchicine 0.6 MG CAPS Take 1 capsule by mouth daily. Reported on 11/09/2015  10  . diclofenac (VOLTAREN) 75 MG EC tablet Take 1 tablet (75 mg total) by mouth 2 (two) times daily. 180 tablet 3  . diclofenac sodium (VOLTAREN) 1 % GEL Apply 2 g topically 4 (four) times daily. 100 g 3  . pravastatin (PRAVACHOL) 40 MG tablet Take 1 tablet (40 mg total) by mouth daily. 90 tablet 3  . traMADol (ULTRAM) 50 MG tablet Take 1-2 tablets (50-100 mg total) by mouth 3 (three) times daily. Up to maximum 5 pills daily 120 tablet 1   No facility-administered medications prior to visit.     ROS Review of Systems  Constitutional: Negative for chills, fever and malaise/fatigue.  Respiratory: Negative.   Cardiovascular: Negative.   Gastrointestinal: Negative.   Genitourinary: Negative.   Musculoskeletal: Positive for joint pain. Negative for falls and myalgias.  Skin: Negative.   Neurological: Positive for sensory change.    Objective:  BP 118/70   Pulse 75   Temp 98 F (36.7 C)   Ht 5\' 11"  (1.803 m)   Wt 178 lb (80.7 kg)   SpO2 99%   BMI 24.83 kg/m   BP Readings from Last 3 Encounters:  04/04/17 118/70  10/04/16 130/80  06/22/16 140/64    Wt Readings from Last 3 Encounters:  04/04/17 178 lb (80.7  kg)  10/04/16 192 lb 8 oz (87.3 kg)  06/22/16 185 lb (83.9 kg)    Physical Exam  Constitutional: Troy Bird is oriented to person, place, and time.  Cardiovascular: Normal rate, regular rhythm and normal heart sounds.   Pulmonary/Chest: Effort normal and breath sounds normal.  Musculoskeletal: Troy Bird exhibits no edema.  Neurological: Troy Bird is alert and oriented to person, place, and time.  Skin: Skin is warm and dry.  Vitals reviewed.   Lab Results  Component Value Date   WBC 7.6 10/04/2016   HGB 14.2 10/04/2016   HCT 41.3 10/04/2016   PLT 287.0 10/04/2016   GLUCOSE 74 10/04/2016   CHOL 182 10/04/2016   TRIG 56.0 10/04/2016   HDL 42.20 10/04/2016   LDLCALC 129 (H) 10/04/2016   ALT 14 10/04/2016   AST 25 10/04/2016   NA 141 10/04/2016   K 3.8 10/04/2016   CL 104 10/04/2016   CREATININE 1.21 10/04/2016   BUN 12 10/04/2016   CO2 29 10/04/2016    No results found.  Assessment & Plan:   Troy Bird was seen today for follow-up.  Diagnoses and all orders for this visit:  Neuropathic pain -     traMADol (ULTRAM) 50 MG tablet; Take 1-2 tablets (50-100 mg total) by mouth 3 (three) times daily. Up to maximum 5 pills daily -     gabapentin (NEURONTIN) 100 MG capsule;  Take 1 capsule (100 mg total) by mouth 2 (two) times daily.  Essential hypertension   I am having Troy Bird start on gabapentin. I am also having him maintain his Colchicine, pravastatin, diclofenac sodium, diclofenac, amLODipine, allopurinol, and traMADol.  Meds ordered this encounter  Medications  . traMADol (ULTRAM) 50 MG tablet    Sig: Take 1-2 tablets (50-100 mg total) by mouth 3 (three) times daily. Up to maximum 5 pills daily    Dispense:  120 tablet    Refill:  0    Order Specific Question:   Supervising Provider    Answer:   Pincus SanesBURNS, STACY J [[id number]]  . gabapentin (NEURONTIN) 100 MG capsule    Sig: Take 1 capsule (100 mg total) by mouth 2 (two) times daily.    Dispense:  60 capsule    Refill:  1    Order  Specific Question:   Supervising Provider    Answer:   Pincus SanesBURNS, STACY J [[id number]]    Follow-up: Return in about 2 weeks (around 04/18/2017) for right leg pain.  Alysia Pennaharlotte Stellah Donovan, NP

## 2017-04-17 ENCOUNTER — Ambulatory Visit: Payer: BLUE CROSS/BLUE SHIELD | Admitting: Nurse Practitioner

## 2017-04-18 ENCOUNTER — Ambulatory Visit (INDEPENDENT_AMBULATORY_CARE_PROVIDER_SITE_OTHER): Payer: BLUE CROSS/BLUE SHIELD | Admitting: Nurse Practitioner

## 2017-04-18 ENCOUNTER — Other Ambulatory Visit (INDEPENDENT_AMBULATORY_CARE_PROVIDER_SITE_OTHER): Payer: BLUE CROSS/BLUE SHIELD

## 2017-04-18 ENCOUNTER — Encounter: Payer: Self-pay | Admitting: Nurse Practitioner

## 2017-04-18 VITALS — BP 130/74 | HR 73 | Temp 98.5°F | Ht 71.0 in | Wt 179.0 lb

## 2017-04-18 DIAGNOSIS — M792 Neuralgia and neuritis, unspecified: Secondary | ICD-10-CM

## 2017-04-18 DIAGNOSIS — R351 Nocturia: Secondary | ICD-10-CM | POA: Diagnosis not present

## 2017-04-18 LAB — BASIC METABOLIC PANEL
BUN: 17 mg/dL (ref 6–23)
CO2: 27 mEq/L (ref 19–32)
Calcium: 6.7 mg/dL — ABNORMAL LOW (ref 8.4–10.5)
Chloride: 102 mEq/L (ref 96–112)
Creatinine, Ser: 1.41 mg/dL (ref 0.40–1.50)
GFR: 54.34 mL/min — ABNORMAL LOW (ref >60.00)
Glucose, Bld: 102 mg/dL — ABNORMAL HIGH (ref 70–99)
Potassium: 4.2 mEq/L (ref 3.5–5.1)
Sodium: 138 mEq/L (ref 135–145)

## 2017-04-18 MED ORDER — GABAPENTIN 100 MG PO CAPS: 100.0000 mg | ORAL_CAPSULE | Freq: Three times a day (TID) | ORAL | 3 refills | Status: DC

## 2017-04-18 NOTE — Patient Instructions (Signed)
Decrease fluid intake in the evening.  Avoid fluid intake 2hrs prior to bedtime. Avoid caffeinated drinks after 3pm.

## 2017-04-18 NOTE — Progress Notes (Signed)
Subjective:  Patient ID: Troy Bird, male    DOB: 1956-09-12  Age: 61 y.o. MRN: [id number]  CC: Follow-up (follow up--still has burning sensation)   HPI  Neuropathy pain in left leg (chronic): Improving functionality and sleep. 50% improvement with gabapentin Denies any adverse effects with gabapentin.  Nocturnal urination: Wakes up 3-4times at bedtime. Ongoing for over 1year No increased frequency or urgency during the day. No dysuria, no hesitancy, no incontinence. Drinks more than 4glasses of liquid in the evening. Has been evaluated by urologist in past. Denies any hx of BPH. No constipation of diarrhea.  Outpatient Medications Prior to Visit  Medication Sig Dispense Refill  . allopurinol (ZYLOPRIM) 300 MG tablet Take 1 tablet (300 mg total) by mouth 2 (two) times daily. 180 tablet 3  . amLODipine (NORVASC) 10 MG tablet Take 1 tablet (10 mg total) by mouth daily. 90 tablet 3  . Colchicine 0.6 MG CAPS Take 1 capsule by mouth daily. Reported on 11/09/2015  10  . diclofenac (VOLTAREN) 75 MG EC tablet Take 1 tablet (75 mg total) by mouth 2 (two) times daily. 180 tablet 3  . diclofenac sodium (VOLTAREN) 1 % GEL Apply 2 g topically 4 (four) times daily. 100 g 3  . pravastatin (PRAVACHOL) 40 MG tablet Take 1 tablet (40 mg total) by mouth daily. 90 tablet 3  . traMADol (ULTRAM) 50 MG tablet Take 1-2 tablets (50-100 mg total) by mouth 3 (three) times daily. Up to maximum 5 pills daily 120 tablet 0  . gabapentin (NEURONTIN) 100 MG capsule Take 1 capsule (100 mg total) by mouth 2 (two) times daily. 60 capsule 1   No facility-administered medications prior to visit.     ROS See HPI  Objective:  BP 130/74   Pulse 73   Temp 98.5 F (36.9 C)   Ht 5\' 11"  (1.803 m)   Wt 179 lb (81.2 kg)   SpO2 100%   BMI 24.97 kg/m   BP Readings from Last 3 Encounters:  04/18/17 130/74  04/04/17 118/70  10/04/16 130/80    Wt Readings from Last 3 Encounters:  04/18/17 179 lb (81.2 kg)    04/04/17 178 lb (80.7 kg)  10/04/16 192 lb 8 oz (87.3 kg)    Physical Exam  Constitutional: He is oriented to person, place, and time. No distress.  Cardiovascular: Normal rate.   Pulmonary/Chest: Effort normal.  Abdominal: Soft. Bowel sounds are normal. He exhibits no distension. There is no tenderness.  Musculoskeletal: He exhibits tenderness. He exhibits no edema.  Neurological: He is alert and oriented to person, place, and time.  Skin: Skin is warm and dry. No erythema.  Vitals reviewed.   Lab Results  Component Value Date   WBC 7.6 10/04/2016   HGB 14.2 10/04/2016   HCT 41.3 10/04/2016   PLT 287.0 10/04/2016   GLUCOSE 102 (H) 04/18/2017   CHOL 182 10/04/2016   TRIG 56.0 10/04/2016   HDL 42.20 10/04/2016   LDLCALC 129 (H) 10/04/2016   ALT 14 10/04/2016   AST 25 10/04/2016   NA 138 04/18/2017   K 4.2 04/18/2017   CL 102 04/18/2017   CREATININE 1.41 04/18/2017   BUN 17 04/18/2017   CO2 27 04/18/2017    Assessment & Plan:   Bosco was seen today for follow-up.  Diagnoses and all orders for this visit:  Neuropathic pain -     gabapentin (NEURONTIN) 100 MG capsule; Take 1 capsule (100 mg total) by mouth 3 (three) times daily.  Take 1cap in morning and 2caps at bedtime -     Basic metabolic panel; Future  Nocturia   I have changed Mr. Kellie MoorSanders's gabapentin. I am also having him maintain his Colchicine, pravastatin, diclofenac sodium, diclofenac, amLODipine, allopurinol, and traMADol.  Meds ordered this encounter  Medications  . gabapentin (NEURONTIN) 100 MG capsule    Sig: Take 1 capsule (100 mg total) by mouth 3 (three) times daily. Take 1cap in morning and 2caps at bedtime    Dispense:  90 capsule    Refill:  3    Order Specific Question:   Supervising Provider    Answer:   Tresa GarterPLOTNIKOV, ALEKSEI V [[id number]]    Follow-up: Return in about 3 months (around 07/19/2017) for with Dr. Okey Duprerawford.  Alysia Pennaharlotte Kedarius Aloisi, NP

## 2017-05-20 ENCOUNTER — Other Ambulatory Visit: Payer: Self-pay | Admitting: Internal Medicine

## 2017-05-23 ENCOUNTER — Other Ambulatory Visit: Payer: Self-pay | Admitting: Internal Medicine

## 2017-06-20 ENCOUNTER — Encounter: Payer: Self-pay | Admitting: Internal Medicine

## 2017-06-20 ENCOUNTER — Ambulatory Visit (INDEPENDENT_AMBULATORY_CARE_PROVIDER_SITE_OTHER)
Admission: RE | Admit: 2017-06-20 | Discharge: 2017-06-20 | Disposition: A | Discharge status: Home / Self Care | Payer: BLUE CROSS/BLUE SHIELD | Source: Ambulatory Visit | Attending: Internal Medicine | Admitting: Internal Medicine

## 2017-06-20 ENCOUNTER — Ambulatory Visit (INDEPENDENT_AMBULATORY_CARE_PROVIDER_SITE_OTHER): Payer: BLUE CROSS/BLUE SHIELD | Admitting: Internal Medicine

## 2017-06-20 ENCOUNTER — Other Ambulatory Visit: Payer: BLUE CROSS/BLUE SHIELD

## 2017-06-20 VITALS — BP 144/90 | HR 79 | Temp 98.3°F | Ht 71.0 in | Wt 178.0 lb

## 2017-06-20 DIAGNOSIS — S91001A Unspecified open wound, right ankle, initial encounter: Secondary | ICD-10-CM | POA: Diagnosis not present

## 2017-06-20 DIAGNOSIS — M792 Neuralgia and neuritis, unspecified: Secondary | ICD-10-CM

## 2017-06-20 DIAGNOSIS — Z23 Encounter for immunization: Secondary | ICD-10-CM

## 2017-06-20 DIAGNOSIS — M79605 Pain in left leg: Secondary | ICD-10-CM

## 2017-06-20 DIAGNOSIS — S81801A Unspecified open wound, right lower leg, initial encounter: Secondary | ICD-10-CM | POA: Diagnosis not present

## 2017-06-20 MED ORDER — CYCLOBENZAPRINE HCL 10 MG PO TABS: 10.0000 mg | ORAL_TABLET | Freq: Three times a day (TID) | ORAL | 0 refills | Status: DC | PRN

## 2017-06-20 MED ORDER — SULFAMETHOXAZOLE-TRIMETHOPRIM 800-160 MG PO TABS: 1.0000 | ORAL_TABLET | Freq: Two times a day (BID) | ORAL | 0 refills | Status: DC

## 2017-06-20 MED ORDER — AMLODIPINE BESYLATE 10 MG PO TABS: 10.0000 mg | ORAL_TABLET | Freq: Every day | ORAL | 3 refills | Status: DC

## 2017-06-20 MED ORDER — TRAMADOL HCL 50 MG PO TABS: 50.0000 mg | ORAL_TABLET | Freq: Three times a day (TID) | ORAL | 0 refills | Status: DC
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
## 2017-06-20 NOTE — Patient Instructions (Addendum)
We are going to check an x-ray of the leg and ankle today to see if there is an infection in the leg.   We are starting an antibiotic called bactrim to take 1 pill twice a day for 2 weeks. Depending on the x-ray we may need to do another test to look at the leg.   We have refilled the tramadol and have sent in a new medicine to try for pain called flexeril. You can take 1 pill up to 3 times per day.
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
## 2017-07-19 ENCOUNTER — Encounter: Payer: Self-pay | Admitting: Internal Medicine

## 2017-07-19 ENCOUNTER — Ambulatory Visit (INDEPENDENT_AMBULATORY_CARE_PROVIDER_SITE_OTHER): Payer: BLUE CROSS/BLUE SHIELD | Admitting: Internal Medicine

## 2017-07-19 VITALS — BP 138/90 | HR 98 | Temp 97.6°F | Ht 71.0 in | Wt 172.0 lb

## 2017-07-19 DIAGNOSIS — M25571 Pain in right ankle and joints of right foot: Secondary | ICD-10-CM | POA: Diagnosis not present

## 2017-07-19 DIAGNOSIS — M792 Neuralgia and neuritis, unspecified: Secondary | ICD-10-CM

## 2017-07-19 MED ORDER — GABAPENTIN 300 MG PO CAPS: 300.0000 mg | ORAL_CAPSULE | Freq: Every day | ORAL | 3 refills | Status: DC

## 2017-07-19 NOTE — Progress Notes (Signed)
   Subjective:    Patient ID: Troy Bird, male    DOB: 1956/02/21, 61 y.o.   MRN: [id number]  HPI The patient is a 61 YO man coming in for ankle pain going on since last visit. We had given him a course of antibiotics and flexeril at last visit. He took the antibiotics and this has helped the wound heal some. He is still having severe pain in the leg and ankle which is worse at night time. He is not having swelling any longer. No fevers or chills. Taking tramadol as well which is not helping that much lately.   Review of Systems  Constitutional: Positive for activity change. Negative for appetite change, fatigue, fever and unexpected weight change.  Respiratory: Negative.   Cardiovascular: Negative.   Gastrointestinal: Negative.   Musculoskeletal: Positive for arthralgias, gait problem and myalgias. Negative for back pain.  Skin: Positive for wound.  Neurological: Positive for numbness.      Objective:   Physical Exam  Constitutional: He is oriented to person, place, and time. He appears well-developed and well-nourished.  HENT:  Head: Normocephalic and atraumatic.  Eyes: EOM are normal.  Neck: Normal range of motion.  Cardiovascular: Normal rate and regular rhythm.   Pulmonary/Chest: Effort normal and breath sounds normal. No respiratory distress. He has no wheezes. He has no rales.  Abdominal: Soft. He exhibits no distension. There is no tenderness. There is no rebound.  Musculoskeletal: He exhibits tenderness. He exhibits no edema.  Wound on the right medial shin which is covered with eschar since last visit, no abscess or drainage expressible. Pain in the ankle and shin region, swelling from previous visit is not present.   Neurological: He is alert and oriented to person, place, and time. Coordination abnormal.  Abnormal gait  Skin: Skin is warm and dry.  Psychiatric: He has a normal mood and affect.   Vitals:   07/19/17 0920  BP: 138/90  Pulse: 98  Temp: 97.6 F (36.4  C)  TempSrc: Oral  SpO2: 100%  Weight: 172 lb (78 kg)  Height:  (1.803 m)      Assessment & Plan:

## 2017-07-19 NOTE — Patient Instructions (Signed)
We have sent in the gabapentin for the pain in the legs. Take 1 pill at night time for the first 2-3 days. Then if the pain is still bad you can increase to taking 2 pills at night time.   We will get you back in with the orthopedic doctor to see if there is anything they can do about the ankle.

## 2017-07-19 NOTE — Assessment & Plan Note (Signed)
Rx for gabapentin and referral to orthopedic to evaluate if hardware position is changed. No infection today and wound is healing well.

## 2017-07-25 ENCOUNTER — Encounter: Payer: Self-pay | Admitting: Internal Medicine

## 2017-07-25 ENCOUNTER — Ambulatory Visit (INDEPENDENT_AMBULATORY_CARE_PROVIDER_SITE_OTHER): Payer: BLUE CROSS/BLUE SHIELD | Admitting: Internal Medicine

## 2017-07-25 DIAGNOSIS — M792 Neuralgia and neuritis, unspecified: Secondary | ICD-10-CM | POA: Diagnosis not present

## 2017-07-25 MED ORDER — GABAPENTIN 300 MG PO CAPS: 300.0000 mg | ORAL_CAPSULE | Freq: Three times a day (TID) | ORAL | 3 refills | Status: DC | PRN

## 2017-07-25 MED ORDER — LIDOCAINE 5 % EX PTCH: 1.0000 | MEDICATED_PATCH | CUTANEOUS | 0 refills | Status: DC

## 2017-07-25 NOTE — Patient Instructions (Signed)
We will have you take 2 pills of the gabapentin in the evening.   Take 1 pill gabapentin in the morning to help.  We have also sent in a patch to use on the pain.   We are working on the orthopedic specialist.
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
## 2017-07-26 NOTE — Assessment & Plan Note (Signed)
Despite reports of no better initially he is actually able to sleep at night which is a significant improvement. Will increase frequency of gabapentin to 2 pills at nightime, 1 pill in the morning and can adjust as needed. Rx for lidoderm patch as well. Still awaiting orthopedic referral for more permanent solution if there is any.

## 2017-07-31 ENCOUNTER — Encounter (INDEPENDENT_AMBULATORY_CARE_PROVIDER_SITE_OTHER): Payer: Self-pay | Admitting: Orthopaedic Surgery

## 2017-07-31 ENCOUNTER — Ambulatory Visit (INDEPENDENT_AMBULATORY_CARE_PROVIDER_SITE_OTHER): Payer: BLUE CROSS/BLUE SHIELD | Admitting: Orthopedic Surgery

## 2017-07-31 DIAGNOSIS — I87331 Chronic venous hypertension (idiopathic) with ulcer and inflammation of right lower extremity: Secondary | ICD-10-CM

## 2017-07-31 DIAGNOSIS — L97919 Non-pressure chronic ulcer of unspecified part of right lower leg with unspecified severity: Secondary | ICD-10-CM

## 2017-07-31 MED ORDER — PENTOXIFYLLINE ER 400 MG PO TBCR: 400.0000 mg | EXTENDED_RELEASE_TABLET | Freq: Three times a day (TID) | ORAL | 3 refills | Status: AC

## 2017-07-31 NOTE — Addendum Note (Signed)
Addended by: Aldean BakerUDA, MARCUS on: 07/31/2017 11:21 AM   Modules accepted: Orders
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
## 2017-08-01 ENCOUNTER — Telehealth: Payer: Self-pay

## 2017-08-01 NOTE — Telephone Encounter (Signed)
PA started on CoverMyMeds [id number] Has been approved

## 2017-08-07 ENCOUNTER — Ambulatory Visit (INDEPENDENT_AMBULATORY_CARE_PROVIDER_SITE_OTHER): Payer: BLUE CROSS/BLUE SHIELD | Admitting: Orthopedic Surgery

## 2017-08-07 ENCOUNTER — Encounter (INDEPENDENT_AMBULATORY_CARE_PROVIDER_SITE_OTHER): Payer: Self-pay | Admitting: Orthopedic Surgery

## 2017-08-07 DIAGNOSIS — L97919 Non-pressure chronic ulcer of unspecified part of right lower leg with unspecified severity: Secondary | ICD-10-CM

## 2017-08-07 DIAGNOSIS — I87331 Chronic venous hypertension (idiopathic) with ulcer and inflammation of right lower extremity: Secondary | ICD-10-CM | POA: Diagnosis not present

## 2017-08-07 NOTE — Progress Notes (Signed)
Office Visit Note   Patient: Troy Bird           Date of Birth: 05-19-1956           MRN: [id number] Visit Date: 08/07/2017              Requested by: Myrlene Brokerrawford, Elizabeth A, MD [address] [place], [place] [phone] PCP: Myrlene Brokerrawford, Elizabeth A, MD  Chief Complaint  Patient presents with  . 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  Patient was placed in a Dynaflex wrap and he remove this after 2 days due to pain.  Patient is currently taking the Trental 400 mg 3 times a day.  Assessment & Plan: Visit Diagnoses:  1. 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  He will wear the stocking around the clock take it off daily wash the leg with soap and water and applying the stockings and wash the old stocking.  He is to keep his leg elevated as much as possible follow-up in 1 week.  Follow-Up Instructions: Return in about 1 week (around 08/14/2017).   Ortho Exam  Patient is alert, oriented, no adenopathy, well-dressed, normal affect, normal respiratory effort. Examination patient has a normal gait.  He has a large nonviable wound over the distal medial aspect of the right leg.  This is 9 cm in length 3 cm in width and 3 mm deep.  There is fibrinous nonviable necrotic tissue there is no granulation tissue there is no purulent abscess no cellulitis no odor no drainage.  Patient does  have a palpable pulse.  He has brawny skin color changes with venous stasis insufficiency changes with pitting edema up to the tibial tubercle.  Imaging: No results found. No images are attached to the encounter.  Labs: Lab Results  Component Value Date   LABURIC 4.1 11/09/2015   LABURIC 8.7 (H) 09/01/2015    Orders:  No orders of  the defined types were placed in this encounter.  No orders of the defined types were placed in this encounter.    Procedures: No procedures performed  Clinical Data: No additional findings.  ROS:  All other systems negative, except as noted in the HPI. Review of Systems  Objective: Vital Signs: There were no vitals taken for this visit.  Specialty Comments:  No specialty comments available.  PMFS History: Patient Active Problem List   Diagnosis Date Noted  . 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 Left leg pain 06/20/2017  . Nocturia 04/18/2017  . Routine general medical examination at a health care facility 10/04/2016  . Low back pain 06/22/2016  . Cigarette smoker 11/09/2015  . Gout 11/09/2015  . Essential hypertension 09/01/2015  . Neuropathic pain 09/01/2015  . History of DVT of lower extremity 09/01/2015  . Hyperlipidemia 09/01/2015   Past Medical History:  Diagnosis Date  . Alcohol abuse   . Colon polyps   . Hx of blood clots   . Hyperlipidemia   . Hypertension   . Phlebitis     Family History  Problem Relation Age of Onset  . Arthritis Mother   . Hypertension Mother   . Alcohol abuse Father   . Hypertension Father   .  Diabetes Father     Past Surgical History:  Procedure Laterality Date  . LEG SURGERY Right 2002  . TRACHEAL ESOPHAGEAL PUNCTURE REPAIR  2002   Social History   Occupational History  . Not on file.   Social History Main Topics  . Smoking status: Current Some Day Smoker  . Smokeless tobacco: Never Used  . Alcohol use No  . Drug use: No  . Sexual activity: Not on file

## 2017-08-15 ENCOUNTER — Encounter (INDEPENDENT_AMBULATORY_CARE_PROVIDER_SITE_OTHER): Payer: Self-pay | Admitting: Orthopedic Surgery

## 2017-08-15 ENCOUNTER — Ambulatory Visit (INDEPENDENT_AMBULATORY_CARE_PROVIDER_SITE_OTHER): Payer: BLUE CROSS/BLUE SHIELD | Admitting: Orthopedic Surgery

## 2017-08-15 VITALS — Ht 71.0 in | Wt 172.0 lb

## 2017-08-15 DIAGNOSIS — L97919 Non-pressure chronic ulcer of unspecified part of right lower leg with unspecified severity: Secondary | ICD-10-CM

## 2017-08-15 DIAGNOSIS — I87331 Chronic venous hypertension (idiopathic) with ulcer and inflammation of right lower extremity: Secondary | ICD-10-CM | POA: Diagnosis not present

## 2017-08-15 NOTE — Progress Notes (Signed)
Office Visit Note   Patient: Troy Bird           Date of Birth: 1956/04/29           MRN: [id number] Visit Date: 08/15/2017              Requested by: Myrlene Brokerrawford, Elizabeth A, MD [address] [place], [place] [phone] PCP: Myrlene Brokerrawford, Elizabeth A, MD  Chief Complaint  Patient presents with  . Left Leg - Follow-up  . 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  Patient was previously placed in a Dynaflex wrap and he removed this after 2 days due to pain.  Patient is currently taking the Trental 400 mg 3 times a day.  Is now applying a Lidocaine patch directly to his ulcer. Has the Vive compression stockings, states cannot tolerate wearing them all day due to pain.  Assessment & Plan: Visit Diagnoses:  No diagnosis found.  Plan: We will have the patient resume the medical compression stockings 15-20 mm graduated compression.  He will wear the stocking around the clock take it off daily wash the leg with soap and water and applying the stockings and wash the old stocking.  He is to keep his leg elevated as much as possible follow-up in 1 more week. Educated about proper use of Lidocaine patches. Encouraged to wear the compression around the clock.   Follow-Up Instructions: No Follow-up on file.   Ortho Exam  Patient is alert, oriented, no adenopathy, well-dressed, normal affect, normal respiratory effort. Examination patient has a normal gait.  He has a large nonviable wound over the distal medial aspect of the right leg.  This is 9 cm in length 3 cm in width and 3 mm deep.  There is fibrinous nonviable tissue there is about 50% granulation tissue. there is no purulent abscess no cellulitis no odor no drainage.  Patient does  have a palpable pulse.  He has brawny skin color changes with venous stasis insufficiency changes with pitting edema up to the tibial tubercle.  Imaging: No  results found. No images are attached to the encounter.  Labs: Lab Results  Component Value Date   LABURIC 4.1 11/09/2015   LABURIC 8.7 (H) 09/01/2015    Orders:  No orders of the defined types were placed in this encounter.  No orders of the defined types were placed in this encounter.    Procedures: No procedures performed  Clinical Data: No additional findings.  ROS:  All other systems negative, except as noted in the HPI. Review of Systems  Constitutional: Negative for chills and fever.  Cardiovascular: Positive for leg swelling.  Skin: Positive for wound.    Objective: Vital Signs: Ht 5\' 11"  (1.803 m)   Wt 172 lb (78 kg)   BMI 23.99 kg/m   Specialty Comments:  No specialty comments available.  PMFS History: Patient Active Problem List   Diagnosis Date Noted  . 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 Left leg pain 06/20/2017  . Nocturia 04/18/2017  . Routine general medical examination at a health care facility 10/04/2016  . Low back pain 06/22/2016  . Cigarette smoker 11/09/2015  . Gout 11/09/2015  . Essential hypertension 09/01/2015  . Neuropathic pain 09/01/2015  . History of DVT of lower extremity 09/01/2015  . Hyperlipidemia 09/01/2015   Past Medical History:  Diagnosis  Date  . Alcohol abuse   . Colon polyps   . Hx of blood clots   . Hyperlipidemia   . Hypertension   . Phlebitis     Family History  Problem Relation Age of Onset  . Arthritis Mother   . Hypertension Mother   . Alcohol abuse Father   . Hypertension Father   . Diabetes Father     Past Surgical History:  Procedure Laterality Date  . LEG SURGERY Right 2002  . TRACHEAL ESOPHAGEAL PUNCTURE REPAIR  2002   Social History   Occupational History  . Not on file  Tobacco Use  . Smoking status: Current Some Day Smoker  . Smokeless tobacco: Never Used  Substance and Sexual Activity  . Alcohol use: No      Alcohol/week: 0.0 oz  . Drug use: No  . Sexual activity: Not on file

## 2017-08-22 ENCOUNTER — Ambulatory Visit (INDEPENDENT_AMBULATORY_CARE_PROVIDER_SITE_OTHER): Payer: BLUE CROSS/BLUE SHIELD | Admitting: Orthopedic Surgery

## 2017-08-22 ENCOUNTER — Encounter (INDEPENDENT_AMBULATORY_CARE_PROVIDER_SITE_OTHER): Payer: Self-pay | Admitting: Orthopedic Surgery

## 2017-08-22 DIAGNOSIS — I87331 Chronic venous hypertension (idiopathic) with ulcer and inflammation of right lower extremity: Secondary | ICD-10-CM | POA: Diagnosis not present

## 2017-08-22 DIAGNOSIS — L97919 Non-pressure chronic ulcer of unspecified part of right lower leg with unspecified severity: Secondary | ICD-10-CM

## 2017-08-22 NOTE — Progress Notes (Signed)
Office Visit Note   Patient: Troy PersonsRonald Bird           Date of Birth: October 16, 1955           MRN: [id number] Visit Date: 08/22/2017              Requested by: Myrlene Brokerrawford, Elizabeth A, MD [address] [place], [place] [phone] PCP: Myrlene Brokerrawford, Elizabeth A, MD  Chief Complaint  Patient presents with  . Right Leg - Follow-up      HPI: Presents in follow-up for a large venous stasis insufficiency ulcer medial aspect of the distal right calf.  Patient tried compression stockings and states that these were painful.  Patient has had compression wraps in the past which she has tolerated.  Assessment & Plan: Visit Diagnoses:  1. 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  Follow-Up Instructions: Return in about 1 week (around 08/29/2017).   Ortho Exam  Patient is alert, oriented, no adenopathy, well-dressed, normal affect, normal respiratory effort. Examination patient has brawny skin color changes in the right foot.  He has good capillary refill he does not have a strong palpable pulse and has mixed arterial venous insufficiency.  He has massive brawny skin color changes with pitting edema to the right leg the wound has approximately 75% beefy granulation tissue with some clear drainage there is no cellulitis no odor no signs of infection.  The wound is approximately 8 x 4 cm and 2 mm deep.  Imaging: No results found. No images are attached to the encounter.  Labs: Lab Results  Component Value Date   LABURIC 4.1 11/09/2015   LABURIC 8.7 (H) 09/01/2015    Orders:  No orders of the defined types were placed in this encounter.  No orders of the defined types were placed in this encounter.    Procedures: No procedures performed  Clinical Data: No additional findings.  ROS:  All other systems negative, except as  noted in the HPI. Review of Systems  Objective: Vital Signs: There were no vitals taken for this visit.  Specialty Comments:  No specialty comments available.  PMFS History: Patient Active Problem List   Diagnosis Date Noted  . 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 Left leg pain 06/20/2017  . Nocturia 04/18/2017  . Routine general medical examination at a health care facility 10/04/2016  . Low back pain 06/22/2016  . Cigarette smoker 11/09/2015  . Gout 11/09/2015  . Essential hypertension 09/01/2015  . Neuropathic pain 09/01/2015  . History of DVT of lower extremity 09/01/2015  . Hyperlipidemia 09/01/2015   Past Medical History:  Diagnosis Date  . Alcohol abuse   . Colon polyps   . Hx of blood clots   . Hyperlipidemia   . Hypertension   . Phlebitis     Family History  Problem Relation Age of Onset  . Arthritis Mother   . Hypertension Mother   . Alcohol abuse Father   . Hypertension Father   . Diabetes Father     Past Surgical History:  Procedure Laterality Date  . LEG SURGERY Right 2002  . TRACHEAL ESOPHAGEAL PUNCTURE REPAIR  2002   Social History   Occupational History  . Not on file  Tobacco Use  . Smoking status: Current Some Day Smoker  .  Smokeless tobacco: Never Used  Substance and Sexual Activity  . Alcohol use: No    Alcohol/week: 0.0 oz  . Drug use: No  . Sexual activity: Not on file

## 2017-08-28 ENCOUNTER — Ambulatory Visit (INDEPENDENT_AMBULATORY_CARE_PROVIDER_SITE_OTHER): Payer: BLUE CROSS/BLUE SHIELD | Admitting: Orthopedic Surgery

## 2017-08-28 ENCOUNTER — Encounter (INDEPENDENT_AMBULATORY_CARE_PROVIDER_SITE_OTHER): Payer: Self-pay | Admitting: Orthopedic Surgery

## 2017-08-28 DIAGNOSIS — L97919 Non-pressure chronic ulcer of unspecified part of right lower leg with unspecified severity: Secondary | ICD-10-CM | POA: Diagnosis not present

## 2017-08-28 DIAGNOSIS — I87331 Chronic venous hypertension (idiopathic) with ulcer and inflammation of right lower extremity: Secondary | ICD-10-CM

## 2017-08-28 DIAGNOSIS — Z91199 Patient's noncompliance with other medical treatment and regimen due to unspecified reason: Secondary | ICD-10-CM

## 2017-08-28 DIAGNOSIS — Z9119 Patient's noncompliance with other medical treatment and regimen: Secondary | ICD-10-CM | POA: Diagnosis not present

## 2017-08-28 NOTE — Progress Notes (Signed)
Office Visit Note   Patient: Lanell PersonsRonald Sortor           Date of Birth: 1956/05/05           MRN: [id number] Visit Date: 08/28/2017              Requested by: Myrlene Brokerrawford, Elizabeth A, MD [address] [place], [place] [phone] PCP: Myrlene Brokerrawford, Elizabeth A, MD  No chief complaint on file.     HPI: Presents in follow-up for a large venous stasis insufficiency ulcer medial aspect of the distal right calf.  Patient tried compression stockings and states that these were painful.  Patient had compression wraps in the past which he has tolerated. Was placed in a Dynaflex wrap 1 week ago. Did not tolerate this, removed after 3 days due to pain.   Today presents with swelling and painful venous ulcer. No compression or dressing in place. Does state had been applying the Lidocaine patches directly to the wound. Has run out of patches and is no longer dressing at all.   Assessment & Plan: Visit Diagnoses:  1. 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 Compliance poor     Plan: We will apply iodosorb and dressing with ace for compression. Discussed importance of elevation and compression for wound healing. Discussed treatment possibilities. Patient states would like to give the Vive compression stockings another try. Prefers this to compression wraps. Will get back in the vive compression stockings as soon as he gets home. Will re evaluate in 2 weeks.   Follow-Up Instructions: Return in about 2 weeks (around 09/11/2017).   Ortho Exam  Patient is alert, oriented, no adenopathy, well-dressed, normal affect, normal respiratory effort. Examination patient has brawny skin color changes in the right foot.  He has good capillary refill he does not have a strong palpable pulse and has mixed arterial venous insufficiency.  He has massive brawny skin color changes with pitting edema to the right leg the wound has approximately 50% beefy granulation  tissue. Nor drainage today. there is no cellulitis no odor no signs of infection.  The wound is approximately 10.5 x 6.5 cm and 2 mm deep.  Imaging: No results found. No images are attached to the encounter.  Labs: Lab Results  Component Value Date   LABURIC 4.1 11/09/2015   LABURIC 8.7 (H) 09/01/2015    Orders:  No orders of the defined types were placed in this encounter.  No orders of the defined types were placed in this encounter.    Procedures: No procedures performed  Clinical Data: No additional findings.  ROS:  All other systems negative, except as noted in the HPI. Review of Systems  Constitutional: Negative for chills and fever.  Cardiovascular: Positive for leg swelling.  Skin: Positive for wound. Negative for color change.    Objective: Vital Signs: There were no vitals taken for this visit.  Specialty Comments:  No specialty comments available.  PMFS History: Patient Active Problem List   Diagnosis Date Noted  . Compliance poor 08/28/2017  . 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 Left leg pain 06/20/2017  . Nocturia 04/18/2017  . Routine general medical examination at a health care facility 10/04/2016  . Low back pain 06/22/2016  . Cigarette smoker 11/09/2015  . Gout 11/09/2015  . Essential hypertension 09/01/2015  . Neuropathic pain 09/01/2015  . History of DVT of lower extremity  09/01/2015  . Hyperlipidemia 09/01/2015   Past Medical History:  Diagnosis Date  . Alcohol abuse   . Colon polyps   . Hx of blood clots   . Hyperlipidemia   . Hypertension   . Phlebitis     Family History  Problem Relation Age of Onset  . Arthritis Mother   . Hypertension Mother   . Alcohol abuse Father   . Hypertension Father   . Diabetes Father     Past Surgical History:  Procedure Laterality Date  . LEG SURGERY Right 2002  . TRACHEAL ESOPHAGEAL PUNCTURE REPAIR  2002   Social  History   Occupational History  . Not on file  Tobacco Use  . Smoking status: Current Some Day Smoker  . Smokeless tobacco: Never Used  Substance and Sexual Activity  . Alcohol use: No    Alcohol/week: 0.0 oz  . Drug use: No  . Sexual activity: Not on file

## 2017-09-11 ENCOUNTER — Ambulatory Visit (INDEPENDENT_AMBULATORY_CARE_PROVIDER_SITE_OTHER): Payer: BLUE CROSS/BLUE SHIELD | Admitting: Orthopedic Surgery

## 2017-09-11 ENCOUNTER — Encounter (INDEPENDENT_AMBULATORY_CARE_PROVIDER_SITE_OTHER): Payer: Self-pay | Admitting: Orthopedic Surgery

## 2017-09-11 ENCOUNTER — Telehealth: Payer: Self-pay | Admitting: Internal Medicine

## 2017-09-11 DIAGNOSIS — M792 Neuralgia and neuritis, unspecified: Secondary | ICD-10-CM | POA: Diagnosis not present

## 2017-09-11 DIAGNOSIS — I87331 Chronic venous hypertension (idiopathic) with ulcer and inflammation of right lower extremity: Secondary | ICD-10-CM

## 2017-09-11 DIAGNOSIS — L97919 Non-pressure chronic ulcer of unspecified part of right lower leg with unspecified severity: Secondary | ICD-10-CM | POA: Diagnosis not present

## 2017-09-11 MED ORDER — TRAMADOL HCL 50 MG PO TABS: 50.0000 mg | ORAL_TABLET | Freq: Two times a day (BID) | ORAL | 0 refills | Status: DC | PRN

## 2017-09-11 NOTE — Progress Notes (Signed)
Office Visit Note   Patient: Troy Bird           Date of Birth: 1956-03-05           MRN: [id number] Visit Date: 09/11/2017              Requested by: Myrlene Brokerrawford, Elizabeth A, MD [address] [place], [place] [phone] PCP: Myrlene Brokerrawford, Elizabeth A, MD  Chief Complaint  Patient presents with  . Right Leg - Open Wound      HPI: Patient presents in follow-up for a chronic venous stasis ulcer on the right lower extremity.  Patient has been wearing over-the-counter knee-high stockings that are not compression and he states that these are too painful to wear.  He states he does not elevate his legs.  Assessment & Plan: Visit Diagnoses:  1. 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 Neuropathic pain     Plan: We will place him in a Dynaflex compression wrap discussed the importance of using the calf muscles to pump the circulation up his leg the importance of walking was discussed the importance of not standing and not letting his leg cane dependent was discussed the importance of elevating his foot level with his heart.  Reevaluate in 1 week to change the compression wrap.  A prescription for Ultram was provided for pain.  Follow-Up Instructions: Return in about 1 week (around 09/18/2017).   Ortho Exam  Patient is alert, oriented, no adenopathy, well-dressed, normal affect, normal respiratory effort. Examination patient has a chronic venous stasis ulcer in the right leg medially.  There is massive pitting edema with brawny skin color changes from the chronic swelling.  Patient has no cellulitis no odor no drainage no signs of infection.  The ulcer measures 9 x 5 cm and does have a crusty scab with no healthy granulation tissue.  Imaging: No results found. No images are attached to the encounter.  Labs: Lab Results  Component Value Date   LABURIC 4.1 11/09/2015   LABURIC 8.7 (H) 09/01/2015     @LABSALLVALUES (HGBA1)@  There is no height or weight on file to calculate BMI.  Orders:  No orders of the defined types were placed in this encounter.  Meds ordered this encounter  Medications  . traMADol (ULTRAM) 50 MG tablet    Sig: Take 1-2 tablets (50-100 mg total) by mouth every 12 (twelve) hours as needed. Up to maximum 5 pills daily    Dispense:  60 tablet    Refill:  0     Procedures: No procedures performed  Clinical Data: No additional findings.  ROS:  All other systems negative, except as noted in the HPI. Review of Systems  Objective: Vital Signs: There were no vitals taken for this visit.  Specialty Comments:  No specialty comments available.  PMFS History: Patient Active Problem List   Diagnosis Date Noted  . Compliance poor 08/28/2017  . 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 Left leg pain 06/20/2017  . Nocturia 04/18/2017  . Routine general medical examination at a health care facility 10/04/2016  . Low back pain 06/22/2016  . Cigarette smoker 11/09/2015  . Gout 11/09/2015  . Essential hypertension 09/01/2015  . Neuropathic pain 09/01/2015  . History of DVT of lower extremity 09/01/2015  . Hyperlipidemia 09/01/2015   Past Medical History:  Diagnosis Date  . Alcohol abuse   . Colon polyps   .  Hx of blood clots   . Hyperlipidemia   . Hypertension   . Phlebitis     Family History  Problem Relation Age of Onset  . Arthritis Mother   . Hypertension Mother   . Alcohol abuse Father   . Hypertension Father   . Diabetes Father     Past Surgical History:  Procedure Laterality Date  . LEG SURGERY Right 2002  . TRACHEAL ESOPHAGEAL PUNCTURE REPAIR  2002   Social History   Occupational History  . Not on file  Tobacco Use  . Smoking status: Current Some Day Smoker  . Smokeless tobacco: Never Used  Substance and Sexual Activity  . Alcohol use: No     Alcohol/week: 0.0 oz  . Drug use: No  . Sexual activity: Not on file

## 2017-09-11 NOTE — Telephone Encounter (Signed)
Copied from CRM [phone]. Topic: Inquiry >> Sep 11, 2017 12:42 PM Troy Bird, Troy Bird: Reason for CRM: pt called to get refills on his Tramadol and his Lidoderm, contact pharmacy or pt if needed

## 2017-09-12 MED ORDER — LIDOCAINE 5 % EX PTCH: 1.0000 | MEDICATED_PATCH | CUTANEOUS | 11 refills | Status: DC

## 2017-09-12 NOTE — Telephone Encounter (Signed)
Lidoderm sent in, tramadol cannot be sent in as it was just sent in from dr. Lajoyce Cornersduda

## 2017-09-12 NOTE — Telephone Encounter (Signed)
Notified pt w/MD response.../lmb 

## 2017-09-12 NOTE — Telephone Encounter (Signed)
Dr Lajoyce Cornersuda refilled tramadol 12/3 Lidocaine ordered OV 07/25/17

## 2017-09-18 ENCOUNTER — Ambulatory Visit (INDEPENDENT_AMBULATORY_CARE_PROVIDER_SITE_OTHER): Payer: BLUE CROSS/BLUE SHIELD | Admitting: Family

## 2017-09-22 ENCOUNTER — Ambulatory Visit (INDEPENDENT_AMBULATORY_CARE_PROVIDER_SITE_OTHER): Payer: BLUE CROSS/BLUE SHIELD | Admitting: Family

## 2017-09-22 ENCOUNTER — Encounter (INDEPENDENT_AMBULATORY_CARE_PROVIDER_SITE_OTHER): Payer: Self-pay | Admitting: Family

## 2017-09-22 VITALS — Ht 71.0 in | Wt 172.0 lb

## 2017-09-22 DIAGNOSIS — M792 Neuralgia and neuritis, unspecified: Secondary | ICD-10-CM | POA: Diagnosis not present

## 2017-09-22 DIAGNOSIS — L97919 Non-pressure chronic ulcer of unspecified part of right lower leg with unspecified severity: Secondary | ICD-10-CM | POA: Diagnosis not present

## 2017-09-22 DIAGNOSIS — I87331 Chronic venous hypertension (idiopathic) with ulcer and inflammation of right lower extremity: Secondary | ICD-10-CM | POA: Diagnosis not present

## 2017-09-22 DIAGNOSIS — Z91199 Patient's noncompliance with other medical treatment and regimen due to unspecified reason: Secondary | ICD-10-CM

## 2017-09-22 DIAGNOSIS — Z9119 Patient's noncompliance with other medical treatment and regimen: Secondary | ICD-10-CM | POA: Diagnosis not present

## 2017-09-22 NOTE — Progress Notes (Signed)
Office Visit Note   Patient: Troy Bird           Date of Birth: May 05, 1956           MRN: [id number] Visit Date: 12/14Lanell Persons/2018              Requested by: Myrlene Brokerrawford, Elizabeth A, MD [address] [place], [place] [phone] PCP: Myrlene Brokerrawford, Elizabeth A, MD  Chief Complaint  Patient presents with  . Right Leg - Follow-up    VSI with ulceration dynaflex compression wrap      HPI: Patient presents in follow-up for a chronic venous stasis ulcer on the right lower extremity.  Patient has been having difficulty complying with treatment. Have tried compression garments and wraps. Has not been able to elevate. Was placed in a Dynaflex wrap last week. Removed on Sunday "so I could take a shower." has not dressed with anything since wrap removed 5 days ago.  Wonders why has not made progress with wound healing. Continues to have pain in ulcer.  Assessment & Plan: Visit Diagnoses:  1. 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 Compliance poor   3. Neuropathic pain     Plan: Discussed the importance of not standing and not letting his leg cane dependent was discussed the importance of elevating his foot level with his heart. Have spoken at length with patient about finding a treatment option that will work for patient. He would like to give the compression garments another try so he can bathe daily. Has pair of Vive compression stockings. Will resume when gets home today. Dry dressing applied in office.   Follow-Up Instructions: Return in about 3 weeks (around 10/11/2017).   Ortho Exam  Patient is alert, oriented, no adenopathy, well-dressed, normal affect, normal respiratory effort. Examination patient has a chronic venous stasis ulcer in the right leg medially.  There is massive pitting edema with brawny skin color changes from the chronic swelling.  Patient has no cellulitis no odor no signs of infection.  The ulcer measures 9 x 5  cm with weeping present, serous fluid. 50% exudative tissue. 50% granulation tissue.  Imaging: No results found. No images are attached to the encounter.  Labs: Lab Results  Component Value Date   LABURIC 4.1 11/09/2015   LABURIC 8.7 (H) 09/01/2015    @LABSALLVALUES (HGBA1)@  Body mass index is 23.99 kg/m.  Orders:  No orders of the defined types were placed in this encounter.  No orders of the defined types were placed in this encounter.    Procedures: No procedures performed  Clinical Data: No additional findings.  ROS:  All other systems negative, except as noted in the HPI. Review of Systems  Constitutional: Negative for chills and fever.  Cardiovascular: Positive for leg swelling.  Skin: Positive for wound.    Objective: Vital Signs: Ht 5\' 11"  (1.803 m)   Wt 172 lb (78 kg)   BMI 23.99 kg/m   Specialty Comments:  No specialty comments available.  PMFS History: Patient Active Problem List   Diagnosis Date Noted  . Compliance poor 08/28/2017  . 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 Left leg pain 06/20/2017  . Nocturia 04/18/2017  . Routine general medical examination at a health care facility 10/04/2016  . Low back pain 06/22/2016  . Cigarette smoker 11/09/2015  . Gout 11/09/2015  . Essential hypertension 09/01/2015  . Neuropathic pain 09/01/2015  .  History of DVT of lower extremity 09/01/2015  . Hyperlipidemia 09/01/2015   Past Medical History:  Diagnosis Date  . Alcohol abuse   . Colon polyps   . Hx of blood clots   . Hyperlipidemia   . Hypertension   . Phlebitis     Family History  Problem Relation Age of Onset  . Arthritis Mother   . Hypertension Mother   . Alcohol abuse Father   . Hypertension Father   . Diabetes Father     Past Surgical History:  Procedure Laterality Date  . LEG SURGERY Right 2002  . TRACHEAL ESOPHAGEAL PUNCTURE REPAIR  2002   Social  History   Occupational History  . Not on file  Tobacco Use  . Smoking status: Current Some Day Smoker  . Smokeless tobacco: Never Used  Substance and Sexual Activity  . Alcohol use: No    Alcohol/week: 0.0 oz  . Drug use: No  . Sexual activity: Not on file

## 2017-10-13 ENCOUNTER — Other Ambulatory Visit: Payer: Self-pay | Admitting: Internal Medicine

## 2017-10-13 ENCOUNTER — Ambulatory Visit (INDEPENDENT_AMBULATORY_CARE_PROVIDER_SITE_OTHER): Payer: BLUE CROSS/BLUE SHIELD | Admitting: Family

## 2017-10-20 ENCOUNTER — Other Ambulatory Visit: Payer: Self-pay | Admitting: Internal Medicine

## 2017-10-20 DIAGNOSIS — M792 Neuralgia and neuritis, unspecified: Secondary | ICD-10-CM

## 2017-10-20 NOTE — Telephone Encounter (Signed)
Control database checked last refill: 09/11/17 LOV: 07/25/17

## 2017-10-23 ENCOUNTER — Telehealth: Payer: Self-pay | Admitting: Internal Medicine

## 2017-10-23 NOTE — Telephone Encounter (Signed)
Check [place] registry last filled 09/11/2017../lmb  

## 2017-10-23 NOTE — Telephone Encounter (Signed)
Ortho is prescribing.

## 2017-10-23 NOTE — Telephone Encounter (Signed)
Please see separate encounter for refill which was declined as orthopedics is managing tramadol.

## 2017-10-23 NOTE — Telephone Encounter (Signed)
Copied from CRM [phone]. Topic: Quick Communication - Rx Refill/Question >> Oct 23, 2017  3:35 PM Alexander BergeronBarksdale, Troy Bird][[id number]]  Has the patient contacted their pharmacy? Yes.   Contact pt if needed

## 2017-10-28 ENCOUNTER — Other Ambulatory Visit (INDEPENDENT_AMBULATORY_CARE_PROVIDER_SITE_OTHER): Payer: Self-pay | Admitting: Orthopedic Surgery

## 2017-10-28 DIAGNOSIS — M792 Neuralgia and neuritis, unspecified: Secondary | ICD-10-CM

## 2017-10-30 NOTE — Telephone Encounter (Signed)
I called into [facility] on [address].

## 2017-11-12 ENCOUNTER — Other Ambulatory Visit: Payer: Self-pay | Admitting: Internal Medicine

## 2017-11-17 ENCOUNTER — Ambulatory Visit (INDEPENDENT_AMBULATORY_CARE_PROVIDER_SITE_OTHER)
Admission: RE | Admit: 2017-11-17 | Discharge: 2017-11-17 | Disposition: A | Discharge status: Home / Self Care | Payer: BLUE CROSS/BLUE SHIELD | Source: Ambulatory Visit | Attending: Family | Admitting: Family

## 2017-11-17 ENCOUNTER — Ambulatory Visit: Payer: BLUE CROSS/BLUE SHIELD | Admitting: Family

## 2017-11-17 VITALS — BP 130/86 | HR 85 | Temp 98.1°F | Ht 71.0 in | Wt 177.1 lb

## 2017-11-17 DIAGNOSIS — I83018 Varicose veins of right lower extremity with ulcer other part of lower leg: Secondary | ICD-10-CM | POA: Diagnosis not present

## 2017-11-17 DIAGNOSIS — L97818 Non-pressure chronic ulcer of other part of right lower leg with other specified severity: Secondary | ICD-10-CM

## 2017-11-17 DIAGNOSIS — M25571 Pain in right ankle and joints of right foot: Secondary | ICD-10-CM

## 2017-11-17 DIAGNOSIS — M792 Neuralgia and neuritis, unspecified: Secondary | ICD-10-CM | POA: Diagnosis not present

## 2017-11-17 DIAGNOSIS — G8929 Other chronic pain: Secondary | ICD-10-CM

## 2017-11-17 MED ORDER — TRAMADOL HCL 50 MG PO TABS: 50.0000 mg | ORAL_TABLET | Freq: Two times a day (BID) | ORAL | 0 refills | Status: DC | PRN

## 2017-11-17 NOTE — Progress Notes (Signed)
Troy PersonsRonald Bird is a 62 y.o. male with the following history as recorded in Epic[employer]:  Patient Active Problem List   Diagnosis Date Noted  . Compliance poor 08/28/2017  . 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 Left leg pain 06/20/2017  . Nocturia 04/18/2017  . Routine general medical examination at a health care facility 10/04/2016  . Low back pain 06/22/2016  . Cigarette smoker 11/09/2015  . Gout 11/09/2015  . Essential hypertension 09/01/2015  . Neuropathic pain 09/01/2015  . History of DVT of lower extremity 09/01/2015  . Hyperlipidemia 09/01/2015    Current Outpatient Medications  Medication Sig Dispense Refill  . allopurinol (ZYLOPRIM) 300 MG tablet Take 1 tablet (300 mg total) by mouth 2 (two) times daily. 180 tablet 3  . amLODipine (NORVASC) 10 MG tablet Take 1 tablet (10 mg total) by mouth daily. 90 tablet 3  . Colchicine 0.6 MG CAPS Take 1 capsule by mouth daily. Reported on 11/09/2015  10  . gabapentin (NEURONTIN) 300 MG capsule Take 1-2 capsules (300-600 mg total) by mouth 3 (three) times daily as needed. 120 capsule 3  . gabapentin (NEURONTIN) 300 MG capsule TAKE 1-2 CAPSULES (300-600 MG TOTAL) BY MOUTH AT BEDTIME. 60 capsule 3  . lidocaine (LIDODERM) 5 % Place 1 patch onto the skin daily. Remove & Discard patch within 12 hours or as directed by MD 30 patch 11  . pentoxifylline (TRENTAL) 400 MG CR tablet Take 1 tablet (400 mg total) by mouth 3 (three) times daily with meals. 90 tablet 3  . pravastatin (PRAVACHOL) 40 MG tablet Take 1 tablet (40 mg total) by mouth daily. Need annual visit with labs for further refills 90 tablet 0  . traMADol (ULTRAM) 50 MG tablet Take 1 tablet (50 mg total) by mouth every 12 (twelve) hours as needed. 60 tablet 0   No current facility-administered medications for this visit.     Allergies: Lyrica [pregabalin]  Past Medical History:  Diagnosis Date  . Alcohol abuse   .  Colon polyps   . Hx of blood clots   . Hyperlipidemia   . Hypertension   . Phlebitis     Past Surgical History:  Procedure Laterality Date  . LEG SURGERY Right 2002  . TRACHEAL ESOPHAGEAL PUNCTURE REPAIR  2002    Family History  Problem Relation Age of Onset  . Arthritis Mother   . Hypertension Mother   . Alcohol abuse Father   . Hypertension Father   . Diabetes Father     Social History   Tobacco Use  . Smoking status: Current Some Day Smoker  . Smokeless tobacco: Never Used  Substance Use Topics  . Alcohol use: No    Alcohol/week: 0.0 oz    Subjective:  Patient presents today with continuing pain secondary to his chronic right lower venous stasis ulcer; unfortunately, this is not a new problems for this patient; he was first seen with the ulcer in September 2018 and was placed on oral antibiotics with no improvement. He was then referred to orthopedics due to concerns for complications from hardware in the leg and has been under their care recently to try and heal the wound; it appears there was some confusion regarding follow-up there. According to their notes, he was due to return in early January but it does not appear he has been there since December; he is asking today for a refill on his Tramadol; he notes the pain is  chronic and finds the combination of Gabapentin and Tramadol to be effective in managing pain;  Objective:  Vitals:   11/17/17 1309  BP: 130/86  Pulse: 85  Temp: 98.1 F (36.7 C)  TempSrc: Oral  SpO2: 98%  Weight: 177 lb 1.9 oz (80.3 kg)  Height: 5\' 11"  (1.803 m)    General: Well developed, well nourished, in no acute distress  Skin : Warm and dry. Large area  ( about 5-6 cm in diameter) of ulceration on lower right ankle/ leg with some Head: Normocephalic and atraumatic  Eyes: Sclera and conjunctiva clear; pupils round and reactive to light; extraocular movements intact  Ears: External normal; canals clear; tympanic membranes normal  Oropharynx:  Pink, supple. No suspicious lesions  Neck: Supple without thyromegaly, adenopathy  Lungs: Respirations unlabored; clear to auscultation bilaterally without wheeze, rales, rhonchi  Musculoskeletal; abnormal appearance of right ankle; venous stasis ulcer noted Extremities: No edema, cyanosis, clubbing  Vessels: Symmetric bilaterally  Neurologic: Alert and oriented; speech intact; face symmetrical; moves all extremities well; CNII-XII intact without focal deficit  Assessment:  1. Chronic pain of right ankle   2. Venous stasis ulcer of other part of right lower leg with other ulcer severity, unspecified whether varicose veins present (HCC)   3. Neuropathic pain     Plan:  Discussed case with patient's PCP and checked pain in [facility] system; feel Tramadol refill appropriate; will update X-rays due to persisting nature of the ankle pain and no improvement after working with orthopedics; also refer to wound clinic for chronic venous stasis ulcer; follow-up to be determined.   No Follow-up on file.  Orders Placed This Encounter  Procedures  . DG Ankle Complete Right    Standing Status:   Future    Number of Occurrences:   1    Standing Expiration Date:   01/16/2019    Order Specific Question:   Reason for Exam (SYMPTOM  OR DIAGNOSIS REQUIRED)    Answer:   lower right leg pain    Order Specific Question:   Preferred imaging location?    Answer:   [facility]    Order Specific Question:   Radiology Contrast Protocol - do NOT remove file path    Answer:   \\[email or web address].pdf  . DG Tibia/Fibula Right    Standing Status:   Future    Number of Occurrences:   1    Standing Expiration Date:   01/16/2019    Order Specific Question:   Reason for Exam (SYMPTOM  OR DIAGNOSIS REQUIRED)    Answer:   chronic right ankle pain    Order Specific Question:   Preferred imaging location?    Answer:   [facility]    Order Specific Question:   Radiology Contrast  Protocol - do NOT remove file path    Answer:   \\[email or web address]\[email or web address]s.pdf  . Ambulatory referral to Wound Clinic    Referral Priority:   Routine    Referral Type:   Consultation    Referral Reason:   Specialty Services Required    Requested Specialty:   Wound Care    Number of Visits Requested:   1    Requested Prescriptions   Signed Prescriptions Disp Refills  . traMADol (ULTRAM) 50 MG tablet 60 tablet 0    Sig: Take 1 tablet (50 mg total) by mouth every 12 (twelve) hours as needed.

## 2017-11-19 ENCOUNTER — Encounter: Payer: Self-pay | Admitting: Family

## 2017-12-04 ENCOUNTER — Encounter (HOSPITAL_BASED_OUTPATIENT_CLINIC_OR_DEPARTMENT_OTHER): Payer: BLUE CROSS/BLUE SHIELD | Attending: Internal Medicine

## 2017-12-04 DIAGNOSIS — I87321 Chronic venous hypertension (idiopathic) with inflammation of right lower extremity: Secondary | ICD-10-CM | POA: Insufficient documentation

## 2017-12-04 DIAGNOSIS — I890 Lymphedema, not elsewhere classified: Secondary | ICD-10-CM | POA: Diagnosis not present

## 2017-12-04 DIAGNOSIS — Z86718 Personal history of other venous thrombosis and embolism: Secondary | ICD-10-CM | POA: Diagnosis not present

## 2017-12-04 DIAGNOSIS — I10 Essential (primary) hypertension: Secondary | ICD-10-CM | POA: Diagnosis not present

## 2017-12-04 DIAGNOSIS — L97212 Non-pressure chronic ulcer of right calf with fat layer exposed: Secondary | ICD-10-CM | POA: Diagnosis not present

## 2017-12-04 DIAGNOSIS — I87331 Chronic venous hypertension (idiopathic) with ulcer and inflammation of right lower extremity: Secondary | ICD-10-CM | POA: Diagnosis not present

## 2017-12-04 DIAGNOSIS — F17210 Nicotine dependence, cigarettes, uncomplicated: Secondary | ICD-10-CM | POA: Insufficient documentation

## 2017-12-04 DIAGNOSIS — L97219 Non-pressure chronic ulcer of right calf with unspecified severity: Secondary | ICD-10-CM | POA: Insufficient documentation

## 2017-12-07 ENCOUNTER — Ambulatory Visit (HOSPITAL_COMMUNITY)
Admission: RE | Admit: 2017-12-07 | Discharge: 2017-12-07 | Disposition: A | Discharge status: Home / Self Care | Payer: BLUE CROSS/BLUE SHIELD | Source: Ambulatory Visit | Attending: Vascular Surgery | Admitting: Vascular Surgery

## 2017-12-07 ENCOUNTER — Other Ambulatory Visit: Payer: Self-pay | Admitting: Internal Medicine

## 2017-12-07 DIAGNOSIS — L97919 Non-pressure chronic ulcer of unspecified part of right lower leg with unspecified severity: Secondary | ICD-10-CM

## 2017-12-11 ENCOUNTER — Encounter (HOSPITAL_BASED_OUTPATIENT_CLINIC_OR_DEPARTMENT_OTHER): Payer: BLUE CROSS/BLUE SHIELD | Attending: Internal Medicine

## 2017-12-11 ENCOUNTER — Ambulatory Visit (INDEPENDENT_AMBULATORY_CARE_PROVIDER_SITE_OTHER)
Admission: RE | Admit: 2017-12-11 | Discharge: 2017-12-11 | Disposition: A | Discharge status: Home / Self Care | Payer: BLUE CROSS/BLUE SHIELD | Source: Ambulatory Visit | Attending: Vascular Surgery | Admitting: Vascular Surgery

## 2017-12-11 ENCOUNTER — Other Ambulatory Visit: Payer: Self-pay | Admitting: Internal Medicine

## 2017-12-11 ENCOUNTER — Ambulatory Visit (HOSPITAL_COMMUNITY)
Admission: RE | Admit: 2017-12-11 | Discharge: 2017-12-11 | Disposition: A | Discharge status: Home / Self Care | Payer: BLUE CROSS/BLUE SHIELD | Source: Ambulatory Visit | Attending: Vascular Surgery | Admitting: Vascular Surgery

## 2017-12-11 DIAGNOSIS — I10 Essential (primary) hypertension: Secondary | ICD-10-CM | POA: Diagnosis not present

## 2017-12-11 DIAGNOSIS — L97812 Non-pressure chronic ulcer of other part of right lower leg with fat layer exposed: Secondary | ICD-10-CM | POA: Diagnosis not present

## 2017-12-11 DIAGNOSIS — L97919 Non-pressure chronic ulcer of unspecified part of right lower leg with unspecified severity: Secondary | ICD-10-CM

## 2017-12-11 DIAGNOSIS — L97212 Non-pressure chronic ulcer of right calf with fat layer exposed: Secondary | ICD-10-CM | POA: Diagnosis not present

## 2017-12-11 DIAGNOSIS — I87311 Chronic venous hypertension (idiopathic) with ulcer of right lower extremity: Secondary | ICD-10-CM | POA: Diagnosis not present

## 2017-12-11 DIAGNOSIS — F17210 Nicotine dependence, cigarettes, uncomplicated: Secondary | ICD-10-CM | POA: Insufficient documentation

## 2017-12-11 DIAGNOSIS — L97819 Non-pressure chronic ulcer of other part of right lower leg with unspecified severity: Secondary | ICD-10-CM | POA: Insufficient documentation

## 2017-12-11 DIAGNOSIS — Z86718 Personal history of other venous thrombosis and embolism: Secondary | ICD-10-CM | POA: Diagnosis not present

## 2017-12-11 DIAGNOSIS — I87321 Chronic venous hypertension (idiopathic) with inflammation of right lower extremity: Secondary | ICD-10-CM | POA: Insufficient documentation

## 2017-12-11 LAB — VAS US LOWER EXTREMITY ARTERIAL DUPLEX
Right popliteal dist sys PSV: 75 cm/s
Right popliteal prox sys PSV: 73 cm/s
Right super femoral dist sys PSV: 97 cm/s
Right super femoral mid sys PSV: 84 cm/s
Right super femoral prox sys PSV: -90 cm/s

## 2017-12-18 DIAGNOSIS — L97819 Non-pressure chronic ulcer of other part of right lower leg with unspecified severity: Secondary | ICD-10-CM | POA: Diagnosis not present

## 2017-12-18 DIAGNOSIS — L97312 Non-pressure chronic ulcer of right ankle with fat layer exposed: Secondary | ICD-10-CM | POA: Diagnosis not present

## 2017-12-18 DIAGNOSIS — I87321 Chronic venous hypertension (idiopathic) with inflammation of right lower extremity: Secondary | ICD-10-CM | POA: Diagnosis not present

## 2017-12-18 DIAGNOSIS — I10 Essential (primary) hypertension: Secondary | ICD-10-CM | POA: Diagnosis not present

## 2017-12-18 DIAGNOSIS — Z86718 Personal history of other venous thrombosis and embolism: Secondary | ICD-10-CM | POA: Diagnosis not present

## 2017-12-18 DIAGNOSIS — F17200 Nicotine dependence, unspecified, uncomplicated: Secondary | ICD-10-CM | POA: Diagnosis not present

## 2017-12-18 DIAGNOSIS — L97812 Non-pressure chronic ulcer of other part of right lower leg with fat layer exposed: Secondary | ICD-10-CM | POA: Diagnosis not present

## 2017-12-18 DIAGNOSIS — F17210 Nicotine dependence, cigarettes, uncomplicated: Secondary | ICD-10-CM | POA: Diagnosis not present

## 2017-12-18 DIAGNOSIS — L97212 Non-pressure chronic ulcer of right calf with fat layer exposed: Secondary | ICD-10-CM | POA: Diagnosis not present

## 2017-12-18 DIAGNOSIS — I87311 Chronic venous hypertension (idiopathic) with ulcer of right lower extremity: Secondary | ICD-10-CM | POA: Diagnosis not present

## 2017-12-25 DIAGNOSIS — I87321 Chronic venous hypertension (idiopathic) with inflammation of right lower extremity: Secondary | ICD-10-CM | POA: Diagnosis not present

## 2017-12-25 DIAGNOSIS — F17210 Nicotine dependence, cigarettes, uncomplicated: Secondary | ICD-10-CM | POA: Diagnosis not present

## 2017-12-25 DIAGNOSIS — L97212 Non-pressure chronic ulcer of right calf with fat layer exposed: Secondary | ICD-10-CM | POA: Diagnosis not present

## 2017-12-25 DIAGNOSIS — L97812 Non-pressure chronic ulcer of other part of right lower leg with fat layer exposed: Secondary | ICD-10-CM | POA: Diagnosis not present

## 2017-12-25 DIAGNOSIS — Z86718 Personal history of other venous thrombosis and embolism: Secondary | ICD-10-CM | POA: Diagnosis not present

## 2017-12-25 DIAGNOSIS — I10 Essential (primary) hypertension: Secondary | ICD-10-CM | POA: Diagnosis not present

## 2017-12-25 DIAGNOSIS — I87311 Chronic venous hypertension (idiopathic) with ulcer of right lower extremity: Secondary | ICD-10-CM | POA: Diagnosis not present

## 2017-12-25 DIAGNOSIS — L97819 Non-pressure chronic ulcer of other part of right lower leg with unspecified severity: Secondary | ICD-10-CM | POA: Diagnosis not present

## 2018-01-02 DIAGNOSIS — L97311 Non-pressure chronic ulcer of right ankle limited to breakdown of skin: Secondary | ICD-10-CM | POA: Diagnosis not present

## 2018-01-02 DIAGNOSIS — F17210 Nicotine dependence, cigarettes, uncomplicated: Secondary | ICD-10-CM | POA: Diagnosis not present

## 2018-01-02 DIAGNOSIS — I10 Essential (primary) hypertension: Secondary | ICD-10-CM | POA: Diagnosis not present

## 2018-01-02 DIAGNOSIS — L97819 Non-pressure chronic ulcer of other part of right lower leg with unspecified severity: Secondary | ICD-10-CM | POA: Diagnosis not present

## 2018-01-02 DIAGNOSIS — I87311 Chronic venous hypertension (idiopathic) with ulcer of right lower extremity: Secondary | ICD-10-CM | POA: Diagnosis not present

## 2018-01-02 DIAGNOSIS — L97812 Non-pressure chronic ulcer of other part of right lower leg with fat layer exposed: Secondary | ICD-10-CM | POA: Diagnosis not present

## 2018-01-02 DIAGNOSIS — Z86718 Personal history of other venous thrombosis and embolism: Secondary | ICD-10-CM | POA: Diagnosis not present

## 2018-01-02 DIAGNOSIS — I87321 Chronic venous hypertension (idiopathic) with inflammation of right lower extremity: Secondary | ICD-10-CM | POA: Diagnosis not present

## 2018-01-09 ENCOUNTER — Encounter (HOSPITAL_BASED_OUTPATIENT_CLINIC_OR_DEPARTMENT_OTHER): Payer: BLUE CROSS/BLUE SHIELD | Attending: Internal Medicine

## 2018-01-09 DIAGNOSIS — L97311 Non-pressure chronic ulcer of right ankle limited to breakdown of skin: Secondary | ICD-10-CM | POA: Diagnosis not present

## 2018-01-09 DIAGNOSIS — Z872 Personal history of diseases of the skin and subcutaneous tissue: Secondary | ICD-10-CM | POA: Insufficient documentation

## 2018-01-09 DIAGNOSIS — I87311 Chronic venous hypertension (idiopathic) with ulcer of right lower extremity: Secondary | ICD-10-CM | POA: Diagnosis not present

## 2018-01-09 DIAGNOSIS — Z86718 Personal history of other venous thrombosis and embolism: Secondary | ICD-10-CM | POA: Insufficient documentation

## 2018-01-09 DIAGNOSIS — I10 Essential (primary) hypertension: Secondary | ICD-10-CM | POA: Diagnosis not present

## 2018-01-09 DIAGNOSIS — Z09 Encounter for follow-up examination after completed treatment for conditions other than malignant neoplasm: Secondary | ICD-10-CM | POA: Diagnosis not present

## 2018-01-09 DIAGNOSIS — I70233 Atherosclerosis of native arteries of right leg with ulceration of ankle: Secondary | ICD-10-CM | POA: Diagnosis not present

## 2018-01-17 ENCOUNTER — Encounter: Payer: Self-pay | Admitting: Vascular Surgery

## 2018-01-17 ENCOUNTER — Ambulatory Visit: Payer: BLUE CROSS/BLUE SHIELD | Admitting: Vascular Surgery

## 2018-01-17 ENCOUNTER — Other Ambulatory Visit: Payer: Self-pay

## 2018-01-17 VITALS — BP 133/87 | HR 80 | Temp 98.0°F | Resp 18 | Ht 71.0 in | Wt 177.5 lb

## 2018-01-17 DIAGNOSIS — I70299 Other atherosclerosis of native arteries of extremities, unspecified extremity: Secondary | ICD-10-CM | POA: Diagnosis not present

## 2018-01-17 DIAGNOSIS — L97909 Non-pressure chronic ulcer of unspecified part of unspecified lower leg with unspecified severity: Secondary | ICD-10-CM
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
## 2018-04-11 ENCOUNTER — Encounter (HOSPITAL_BASED_OUTPATIENT_CLINIC_OR_DEPARTMENT_OTHER): Payer: BLUE CROSS/BLUE SHIELD | Attending: Internal Medicine

## 2018-04-11 DIAGNOSIS — Z86718 Personal history of other venous thrombosis and embolism: Secondary | ICD-10-CM | POA: Insufficient documentation

## 2018-04-11 DIAGNOSIS — I87321 Chronic venous hypertension (idiopathic) with inflammation of right lower extremity: Secondary | ICD-10-CM | POA: Diagnosis not present

## 2018-04-11 DIAGNOSIS — F17210 Nicotine dependence, cigarettes, uncomplicated: Secondary | ICD-10-CM | POA: Insufficient documentation

## 2018-04-11 DIAGNOSIS — I87311 Chronic venous hypertension (idiopathic) with ulcer of right lower extremity: Secondary | ICD-10-CM | POA: Diagnosis not present

## 2018-04-11 DIAGNOSIS — I10 Essential (primary) hypertension: Secondary | ICD-10-CM | POA: Insufficient documentation

## 2018-04-11 DIAGNOSIS — L97212 Non-pressure chronic ulcer of right calf with fat layer exposed: Secondary | ICD-10-CM | POA: Diagnosis not present

## 2018-04-11 DIAGNOSIS — L97812 Non-pressure chronic ulcer of other part of right lower leg with fat layer exposed: Secondary | ICD-10-CM | POA: Diagnosis not present

## 2018-04-16 ENCOUNTER — Other Ambulatory Visit: Payer: Self-pay

## 2018-04-16 DIAGNOSIS — I83009 Varicose veins of unspecified lower extremity with ulcer of unspecified site: Secondary | ICD-10-CM

## 2018-04-16 DIAGNOSIS — L97909 Non-pressure chronic ulcer of unspecified part of unspecified lower leg with unspecified severity: Secondary | ICD-10-CM

## 2018-04-18 DIAGNOSIS — I87321 Chronic venous hypertension (idiopathic) with inflammation of right lower extremity: Secondary | ICD-10-CM | POA: Diagnosis not present

## 2018-04-18 DIAGNOSIS — I87311 Chronic venous hypertension (idiopathic) with ulcer of right lower extremity: Secondary | ICD-10-CM | POA: Diagnosis not present

## 2018-04-18 DIAGNOSIS — I10 Essential (primary) hypertension: Secondary | ICD-10-CM | POA: Diagnosis not present

## 2018-04-18 DIAGNOSIS — L97212 Non-pressure chronic ulcer of right calf with fat layer exposed: Secondary | ICD-10-CM | POA: Diagnosis not present

## 2018-04-18 DIAGNOSIS — F17210 Nicotine dependence, cigarettes, uncomplicated: Secondary | ICD-10-CM | POA: Diagnosis not present

## 2018-04-18 DIAGNOSIS — Z86718 Personal history of other venous thrombosis and embolism: Secondary | ICD-10-CM | POA: Diagnosis not present

## 2018-04-18 DIAGNOSIS — L97812 Non-pressure chronic ulcer of other part of right lower leg with fat layer exposed: Secondary | ICD-10-CM | POA: Diagnosis not present

## 2018-04-25 DIAGNOSIS — L97812 Non-pressure chronic ulcer of other part of right lower leg with fat layer exposed: Secondary | ICD-10-CM | POA: Diagnosis not present

## 2018-04-25 DIAGNOSIS — F17210 Nicotine dependence, cigarettes, uncomplicated: Secondary | ICD-10-CM | POA: Diagnosis not present

## 2018-04-25 DIAGNOSIS — I87321 Chronic venous hypertension (idiopathic) with inflammation of right lower extremity: Secondary | ICD-10-CM | POA: Diagnosis not present

## 2018-04-25 DIAGNOSIS — I10 Essential (primary) hypertension: Secondary | ICD-10-CM | POA: Diagnosis not present

## 2018-04-25 DIAGNOSIS — I87311 Chronic venous hypertension (idiopathic) with ulcer of right lower extremity: Secondary | ICD-10-CM | POA: Diagnosis not present

## 2018-04-25 DIAGNOSIS — L97212 Non-pressure chronic ulcer of right calf with fat layer exposed: Secondary | ICD-10-CM | POA: Diagnosis not present

## 2018-04-25 DIAGNOSIS — Z86718 Personal history of other venous thrombosis and embolism: Secondary | ICD-10-CM | POA: Diagnosis not present

## 2018-05-02 DIAGNOSIS — I10 Essential (primary) hypertension: Secondary | ICD-10-CM | POA: Diagnosis not present

## 2018-05-02 DIAGNOSIS — I87321 Chronic venous hypertension (idiopathic) with inflammation of right lower extremity: Secondary | ICD-10-CM | POA: Diagnosis not present

## 2018-05-02 DIAGNOSIS — F17210 Nicotine dependence, cigarettes, uncomplicated: Secondary | ICD-10-CM | POA: Diagnosis not present

## 2018-05-02 DIAGNOSIS — L97812 Non-pressure chronic ulcer of other part of right lower leg with fat layer exposed: Secondary | ICD-10-CM | POA: Diagnosis not present

## 2018-05-02 DIAGNOSIS — Z86718 Personal history of other venous thrombosis and embolism: Secondary | ICD-10-CM | POA: Diagnosis not present

## 2018-05-09 DIAGNOSIS — L97812 Non-pressure chronic ulcer of other part of right lower leg with fat layer exposed: Secondary | ICD-10-CM | POA: Diagnosis not present

## 2018-05-09 DIAGNOSIS — Z86718 Personal history of other venous thrombosis and embolism: Secondary | ICD-10-CM | POA: Diagnosis not present

## 2018-05-09 DIAGNOSIS — I87321 Chronic venous hypertension (idiopathic) with inflammation of right lower extremity: Secondary | ICD-10-CM | POA: Diagnosis not present

## 2018-05-09 DIAGNOSIS — F17210 Nicotine dependence, cigarettes, uncomplicated: Secondary | ICD-10-CM | POA: Diagnosis not present

## 2018-05-09 DIAGNOSIS — L97219 Non-pressure chronic ulcer of right calf with unspecified severity: Secondary | ICD-10-CM | POA: Diagnosis not present

## 2018-05-09 DIAGNOSIS — I10 Essential (primary) hypertension: Secondary | ICD-10-CM | POA: Diagnosis not present

## 2018-06-20 ENCOUNTER — Encounter: Payer: Self-pay | Admitting: Vascular Surgery

## 2018-06-20 ENCOUNTER — Ambulatory Visit (HOSPITAL_COMMUNITY)
Admission: RE | Admit: 2018-06-20 | Discharge: 2018-06-20 | Disposition: A | Discharge status: Home / Self Care | Payer: BLUE CROSS/BLUE SHIELD | Source: Ambulatory Visit | Attending: Vascular Surgery | Admitting: Vascular Surgery

## 2018-06-20 ENCOUNTER — Ambulatory Visit (INDEPENDENT_AMBULATORY_CARE_PROVIDER_SITE_OTHER): Payer: BLUE CROSS/BLUE SHIELD | Admitting: Vascular Surgery

## 2018-06-20 ENCOUNTER — Other Ambulatory Visit: Payer: Self-pay

## 2018-06-20 VITALS — BP 175/94 | HR 71 | Resp 18 | Ht 71.0 in | Wt 173.0 lb

## 2018-06-20 DIAGNOSIS — I83019 Varicose veins of right lower extremity with ulcer of unspecified site: Secondary | ICD-10-CM | POA: Insufficient documentation

## 2018-06-20 DIAGNOSIS — I872 Venous insufficiency (chronic) (peripheral): Secondary | ICD-10-CM | POA: Diagnosis not present

## 2018-06-20 DIAGNOSIS — L97909 Non-pressure chronic ulcer of unspecified part of unspecified lower leg with unspecified severity: Secondary | ICD-10-CM

## 2018-06-20 DIAGNOSIS — I83009 Varicose veins of unspecified lower extremity with ulcer of unspecified site: Secondary | ICD-10-CM | POA: Diagnosis not present

## 2018-06-20 DIAGNOSIS — L97919 Non-pressure chronic ulcer of unspecified part of right lower leg with unspecified severity: Secondary | ICD-10-CM | POA: Diagnosis not present
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
## 2018-07-09 ENCOUNTER — Other Ambulatory Visit: Payer: Self-pay

## 2018-07-09 DIAGNOSIS — I70299 Other atherosclerosis of native arteries of extremities, unspecified extremity: Secondary | ICD-10-CM

## 2018-07-09 DIAGNOSIS — L97909 Non-pressure chronic ulcer of unspecified part of unspecified lower leg with unspecified severity: Secondary | ICD-10-CM

## 2018-07-18 ENCOUNTER — Ambulatory Visit (HOSPITAL_COMMUNITY)
Admission: RE | Admit: 2018-07-18 | Discharge: 2018-07-18 | Disposition: A | Discharge status: Home / Self Care | Payer: BLUE CROSS/BLUE SHIELD | Source: Ambulatory Visit | Attending: Vascular Surgery | Admitting: Vascular Surgery

## 2018-07-18 ENCOUNTER — Other Ambulatory Visit: Payer: Self-pay

## 2018-07-18 ENCOUNTER — Encounter: Payer: Self-pay | Admitting: Vascular Surgery

## 2018-07-18 ENCOUNTER — Ambulatory Visit (INDEPENDENT_AMBULATORY_CARE_PROVIDER_SITE_OTHER): Payer: BLUE CROSS/BLUE SHIELD | Admitting: Vascular Surgery

## 2018-07-18 VITALS — BP 117/76 | HR 72 | Temp 97.2°F | Resp 16 | Ht 71.0 in | Wt 173.0 lb

## 2018-07-18 DIAGNOSIS — L97909 Non-pressure chronic ulcer of unspecified part of unspecified lower leg with unspecified severity: Secondary | ICD-10-CM

## 2018-07-18 DIAGNOSIS — I872 Venous insufficiency (chronic) (peripheral): Secondary | ICD-10-CM

## 2018-07-18 DIAGNOSIS — I70299 Other atherosclerosis of native arteries of extremities, unspecified extremity: Secondary | ICD-10-CM | POA: Insufficient documentation

## 2018-07-18 NOTE — Progress Notes (Signed)
Patient name: Troy Bird MRN: [id number] DOB: 09-19-1956 Sex: male  REASON FOR VISIT:   Follow-up of chronic venous insufficiency  HPI:   Troy Bird is a pleasant 62 y.o. male who I last saw on 06/20/2018.  This patient had a venous stasis ulcer which had healed.  We discussed conservative treatment for his chronic venous insufficiency.  I felt that if he developed recurrent ulceration we could potentially consider endovenous laser ablation of the right great saphenous vein where it fed into an incompetent perforator.  However he also had significant deep venous reflux.  Since I saw him last he denies any new ulcers.  He continues to have some aching pain associated with standing and relieved somewhat with elevation.  He denies any significant claudication.   Current Outpatient Medications  Medication Sig Dispense Refill  . allopurinol (ZYLOPRIM) 300 MG tablet Take 1 tablet (300 mg total) by mouth 2 (two) times daily. 180 tablet 3  . amLODipine (NORVASC) 10 MG tablet Take 1 tablet (10 mg total) by mouth daily. 90 tablet 3  . Colchicine 0.6 MG CAPS Take 1 capsule by mouth daily. Reported on 11/09/2015  10  . gabapentin (NEURONTIN) 300 MG capsule Take 1-2 capsules (300-600 mg total) by mouth 3 (three) times daily as needed. 120 capsule 3  . gabapentin (NEURONTIN) 300 MG capsule TAKE 1-2 CAPSULES (300-600 MG TOTAL) BY MOUTH AT BEDTIME. 60 capsule 3  . lidocaine (LIDODERM) 5 % Place 1 patch onto the skin daily. Remove & Discard patch within 12 hours or as directed by MD 30 patch 11  . pentoxifylline (TRENTAL) 400 MG CR tablet Take 1 tablet (400 mg total) by mouth 3 (three) times daily with meals. 90 tablet 3  . pravastatin (PRAVACHOL) 40 MG tablet Take 1 tablet (40 mg total) by mouth daily. Need annual visit with labs for further refills 90 tablet 0  . traMADol (ULTRAM) 50 MG tablet Take 1 tablet (50 mg total) by mouth every 12 (twelve) hours as needed. 60 tablet 0   No current  facility-administered medications for this visit.     REVIEW OF SYSTEMS:  [X]  denotes positive finding, [ ]  denotes negative finding Vascular    Leg swelling    Cardiac    Chest pain or chest pressure:    Shortness of breath upon exertion:    Short of breath when lying flat:    Irregular heart rhythm:    Constitutional    Fever or chills:     PHYSICAL EXAM:   Vitals:   07/18/18 1153  BP: 117/76  Pulse: 72  Resp: 16  Temp: (!) 97.2 F (36.2 C)  TempSrc: Oral  SpO2: 98%  Weight: 173 lb (78.5 kg)  Height: 5\' 11"  (1.803 m)    GENERAL: The patient is a well-nourished male, in no acute distress. The vital signs are documented above. CARDIOVASCULAR: There is a regular rate and rhythm. PULMONARY: There is good air exchange bilaterally without wheezing or rales. VASCULAR: He has dilated varicose veins in both legs.  I cannot palpate pedal pulses however both feet are warm and well-perfused.  DATA:   ARTERIAL DOPPLER STUDY: I have independently interpreted his arterial Doppler study today.  On the right side he has a biphasic posterior tibial signal and a triphasic dorsalis pedis signal.  ABI is 100%.  Toe pressure on the right is 86 mmHg.  On the left side, he has a biphasic posterior tibial signal and a triphasic dorsalis pedis signal.  ABI is 100%.  Toe pressure is 96 mmHg.  MEDICAL ISSUES:   CHRONIC VENOUS INSUFFICIENCY: This patient has CEAP clinical class V disease with a heel venous ulcer.  Currently I would not recommend endovenous laser ablation of the right great saphenous vein.  He does have significant deep venous reflux.  There is no incompetence at the saphenofemoral junction.  He has excellent arterial flow.  I plan on seeing him back in 6 months.  He knows to call sooner if he has problems.  Waverly Ferrari [facility] [phone]

## 2019-01-09 ENCOUNTER — Ambulatory Visit: Payer: BLUE CROSS/BLUE SHIELD | Admitting: Vascular Surgery

## 2019-01-16 ENCOUNTER — Ambulatory Visit: Payer: BLUE CROSS/BLUE SHIELD | Admitting: Vascular Surgery

## 2019-03-17 IMAGING — DX DG ANKLE COMPLETE 3+V*R*
3 series · 3 of 3 positions shown · non-contrast
Comparison: Right tibia and fibula series of today's date

CLINICAL DATA: Open wound over the medial aspect of the lower leg
and ankle.

EXAM:
RIGHT ANKLE - COMPLETE 3+ VIEW

[ankle ap]
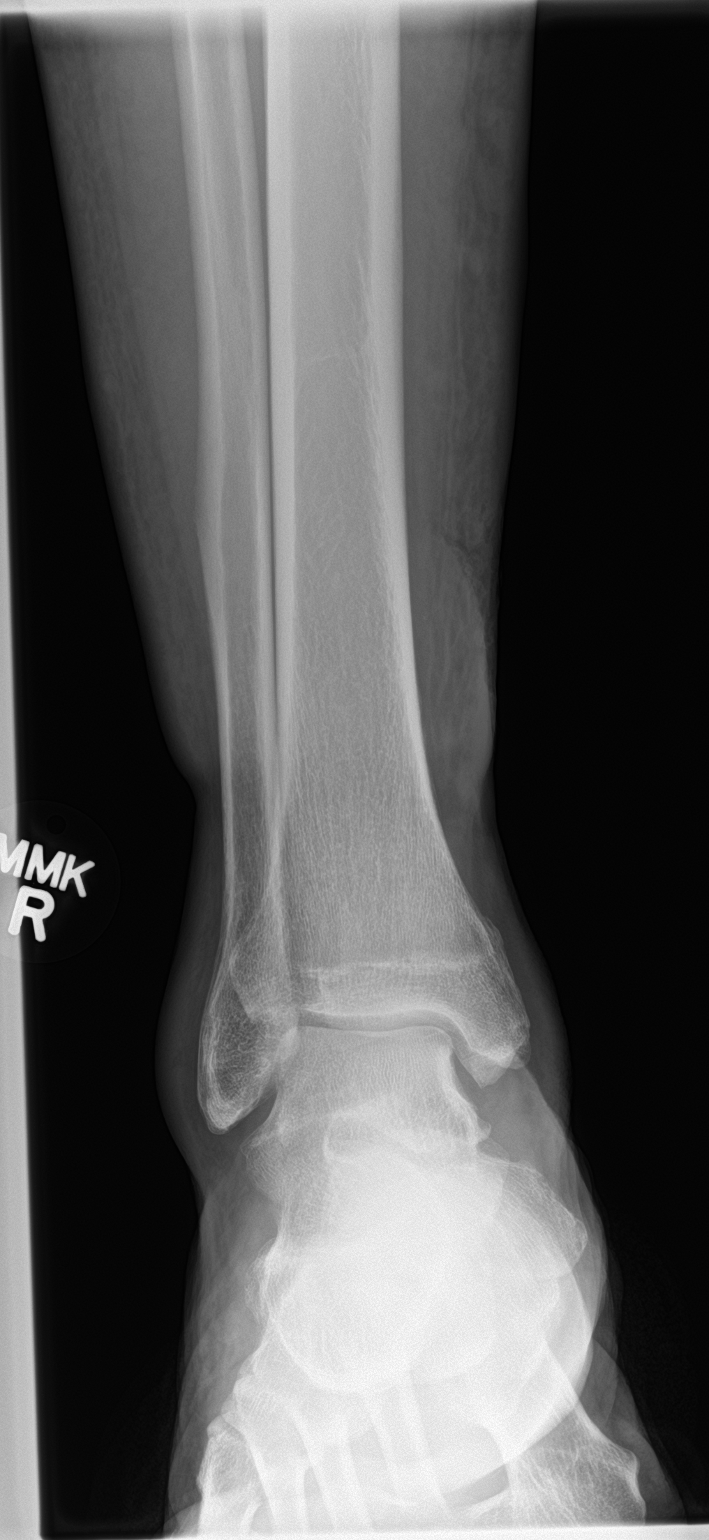

[ankle obl]
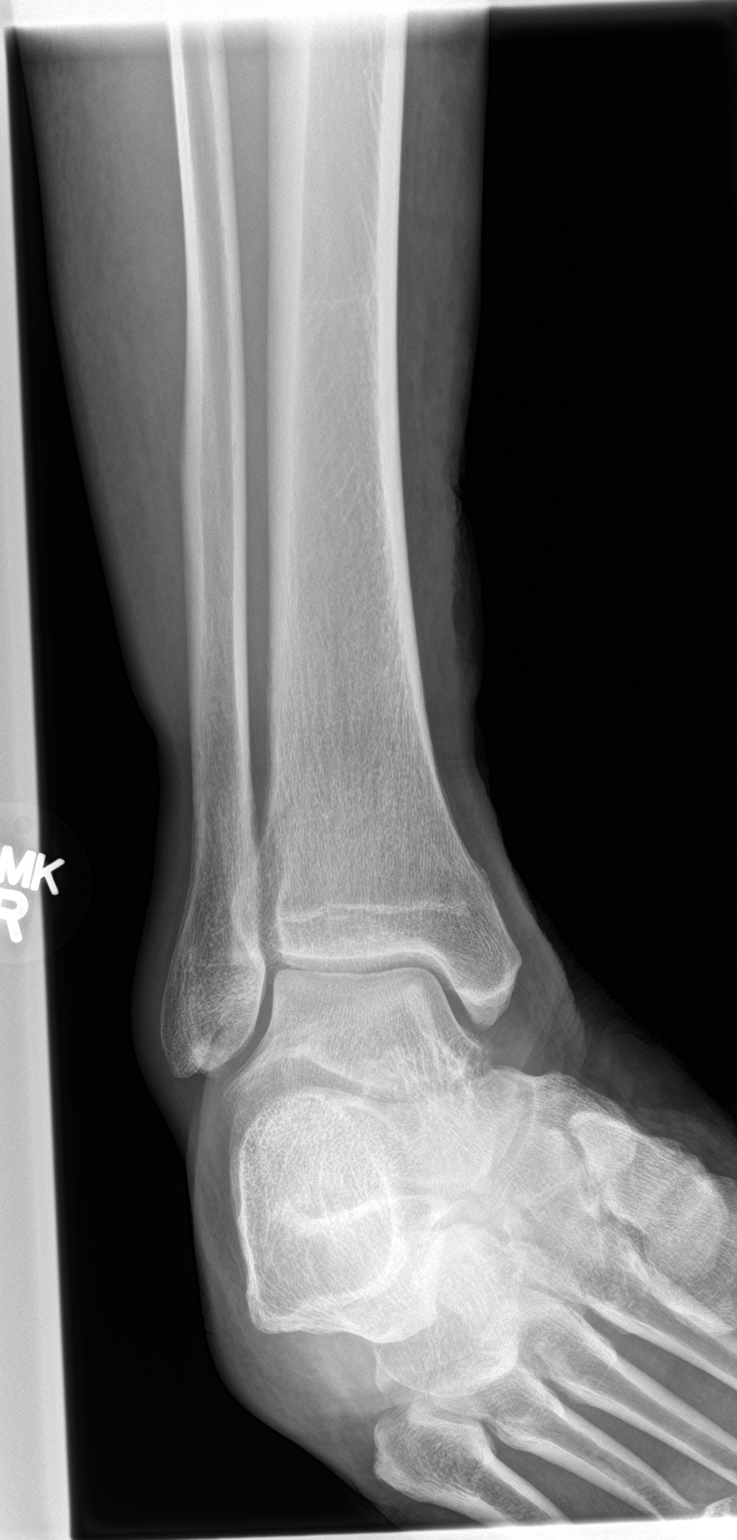

[ankle lat]
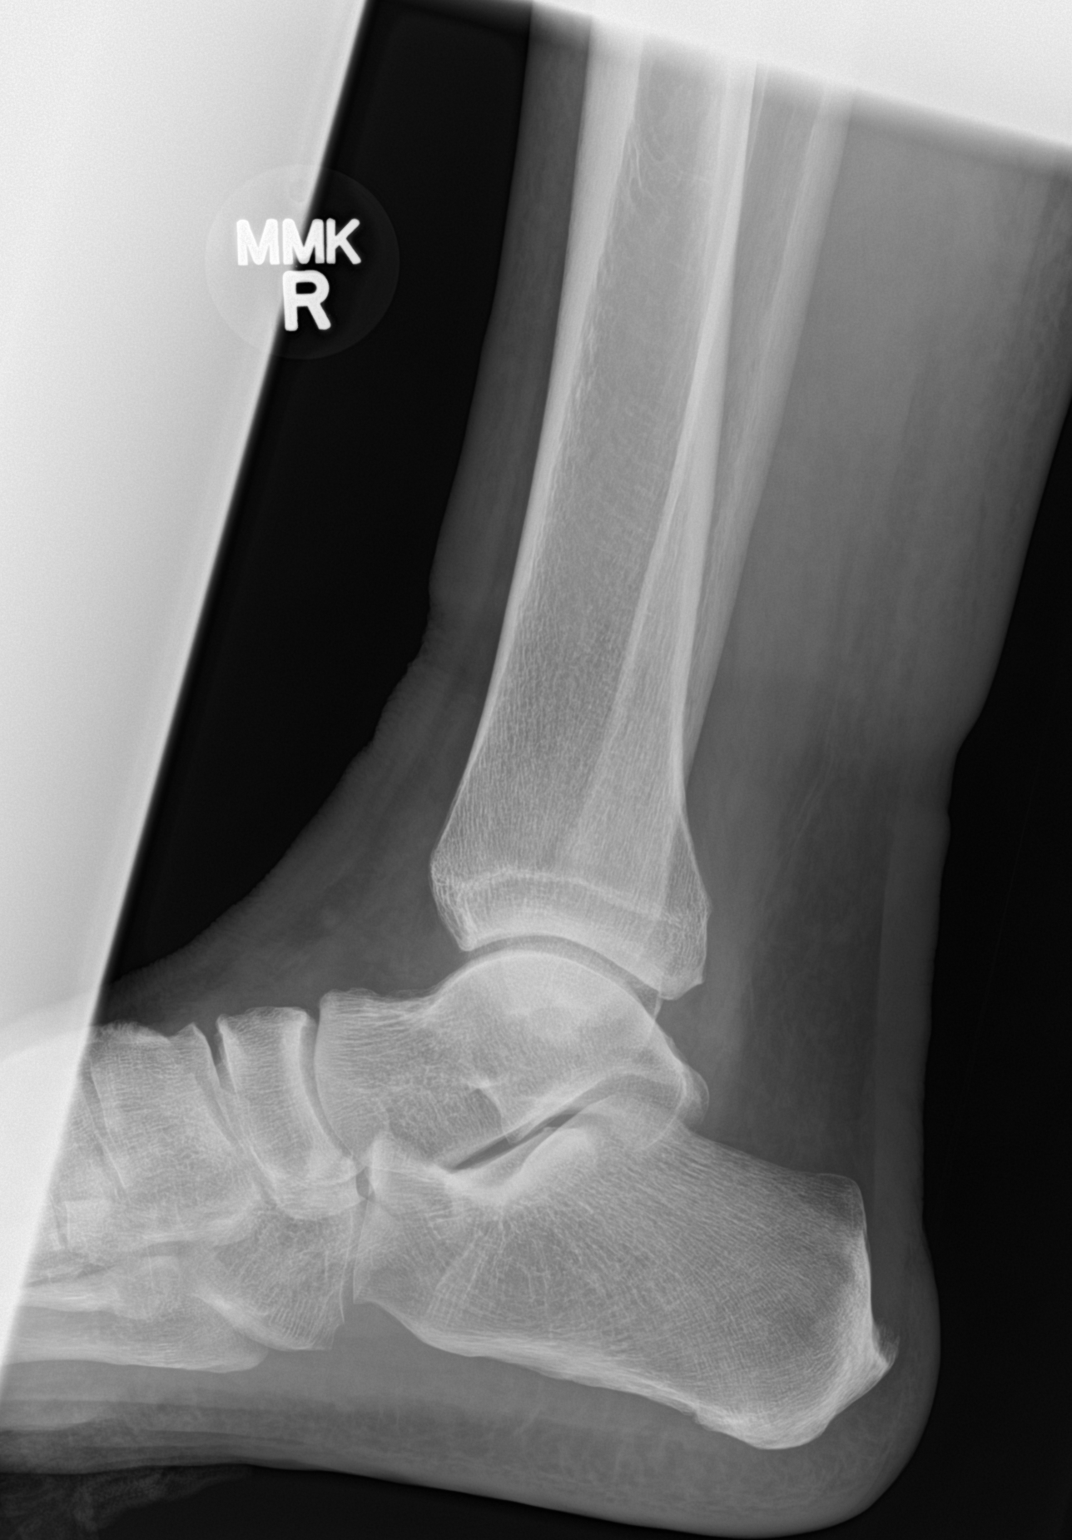

[3 of 3 positions shown; findings below may reference images not displayed]

FINDINGS: The bones about the ankle are adequately mineralized. There is no
lytic or blastic lesion or periosteal reaction. There is no acute or
healing fracture. There is mild diffuse soft tissue swelling over
the ankle. There is irregularity of the cutaneous surface of the
distal leg medially.
IMPRESSION: No radiographic evidence of osteomyelitis. No other acute bony
abnormality. Soft tissue abnormality over the medial aspect of the
distal leg consistent with cellulitis. Mild diffuse soft tissue
swelling about the ankle.

## 2019-03-17 IMAGING — DX DG TIBIA/FIBULA 2V*R*
3 series · 3 of 3 positions shown · non-contrast
Comparison: Right ankle series of today's date

CLINICAL DATA: Open wound over the medial aspect of the right ankle
and distal tibia for several months.

EXAM:
RIGHT TIBIA AND FIBULA - 2 VIEW

[tibia ap]
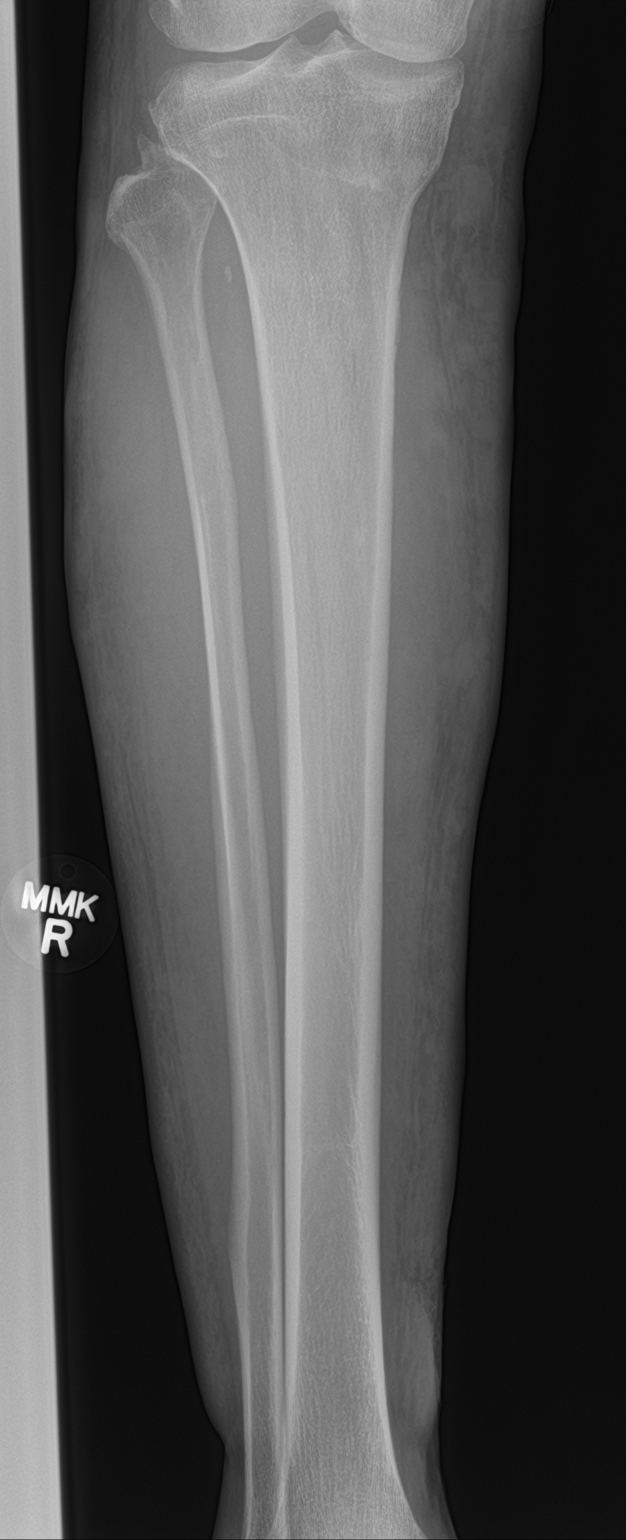

[tibia lat (1 of 2)]
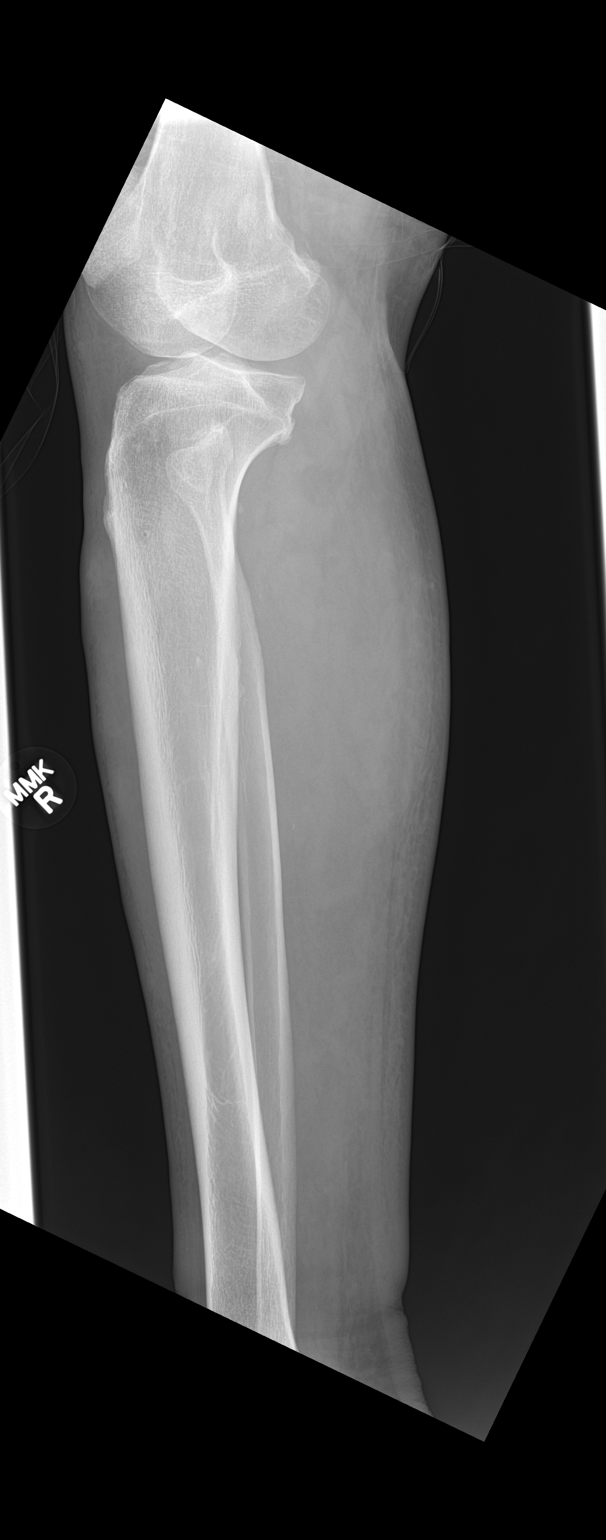

[tibia lat (2 of 2)]
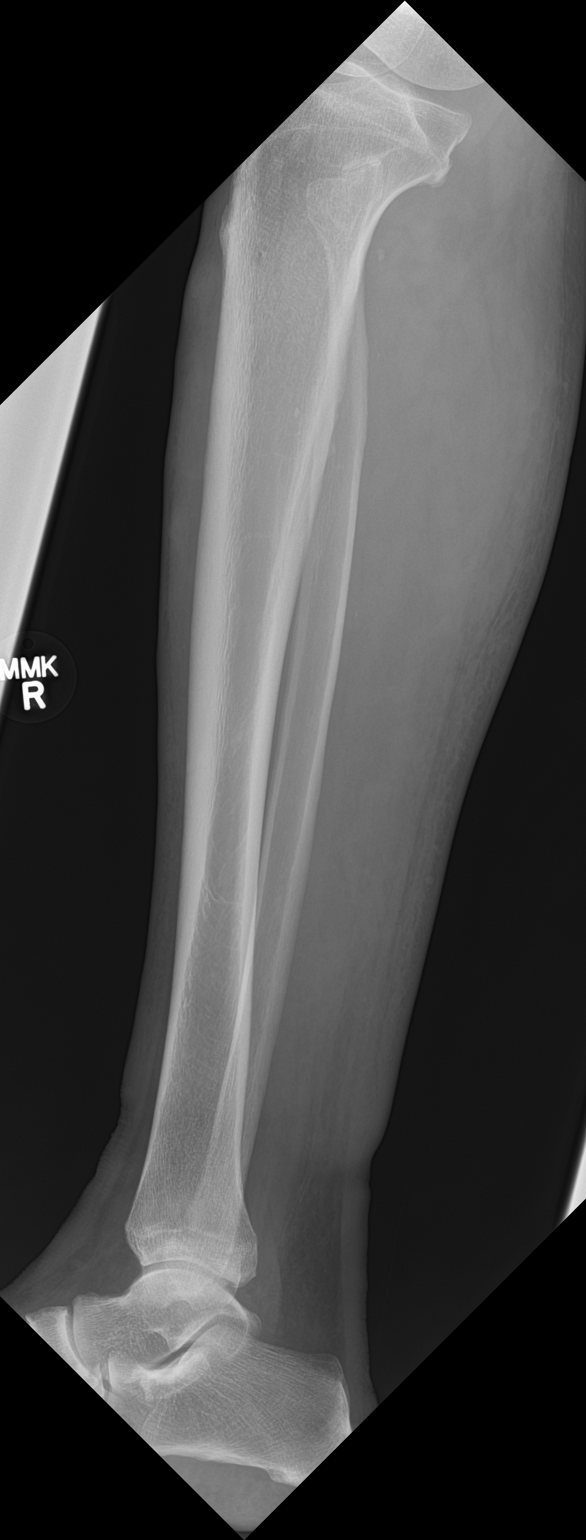

[3 of 3 positions shown; findings below may reference images not displayed]

FINDINGS: The right tibia and fibula are subjectively adequately mineralized.
There is no periosteal reaction or lytic or blastic lesion. The
observed portions of the knee exhibit no acute abnormality.
Visualization of the ankle is limited on this study in discovered in
the adjacent right ankle series. There is soft tissue irregularity
over the distal aspect of the leg medially. There is no foreign body
observed.
IMPRESSION: Soft tissue abnormality consistent with the known open wound over
the medial distal leg. No underlying changes of osteomyelitis.

## 2019-07-09 ENCOUNTER — Other Ambulatory Visit: Payer: Self-pay

## 2019-07-09 ENCOUNTER — Encounter (HOSPITAL_COMMUNITY): Payer: Self-pay | Admitting: Emergency Medicine

## 2019-07-09 ENCOUNTER — Emergency Department (HOSPITAL_COMMUNITY)
Admission: EM | Admit: 2019-07-09 | Discharge: 2019-07-09 | Disposition: A | Discharge status: Home / Self Care | Payer: BLUE CROSS/BLUE SHIELD | Attending: Emergency Medicine | Admitting: Emergency Medicine

## 2019-07-09 ENCOUNTER — Emergency Department (HOSPITAL_COMMUNITY): Payer: BLUE CROSS/BLUE SHIELD

## 2019-07-09 DIAGNOSIS — B029 Zoster without complications: Secondary | ICD-10-CM | POA: Insufficient documentation

## 2019-07-09 DIAGNOSIS — Z79899 Other long term (current) drug therapy: Secondary | ICD-10-CM | POA: Insufficient documentation

## 2019-07-09 DIAGNOSIS — R0789 Other chest pain: Secondary | ICD-10-CM | POA: Diagnosis not present

## 2019-07-09 DIAGNOSIS — F17210 Nicotine dependence, cigarettes, uncomplicated: Secondary | ICD-10-CM | POA: Diagnosis not present

## 2019-07-09 DIAGNOSIS — I10 Essential (primary) hypertension: Secondary | ICD-10-CM | POA: Diagnosis not present

## 2019-07-09 DIAGNOSIS — R079 Chest pain, unspecified: Secondary | ICD-10-CM | POA: Diagnosis not present

## 2019-07-09 LAB — TROPONIN I (HIGH SENSITIVITY): Troponin I (High Sensitivity): 11 ng/L (ref <18)

## 2019-07-09 LAB — BASIC METABOLIC PANEL
Anion gap: 12 (ref 5–15)
BUN: 10 mg/dL (ref 8–23)
CO2: 25 mmol/L (ref 22–32)
Calcium: 6.7 mg/dL — ABNORMAL LOW (ref 8.9–10.3)
Chloride: 101 mmol/L (ref 98–111)
Creatinine, Ser: 1.37 mg/dL — ABNORMAL HIGH (ref 0.61–1.24)
GFR calc Af Amer: >60 mL/min (ref >60)
GFR calc non Af Amer: 55 mL/min — ABNORMAL LOW (ref >60)
Glucose, Bld: 140 mg/dL — ABNORMAL HIGH (ref 70–99)
Potassium: 3.2 mmol/L — ABNORMAL LOW (ref 3.5–5.1)
Sodium: 138 mmol/L (ref 135–145)

## 2019-07-09 LAB — CBC
HCT: 44.5 % (ref 39.0–52.0)
Hemoglobin: 15.4 g/dL (ref 13.0–17.0)
MCH: 30.4 pg (ref 26.0–34.0)
MCHC: 34.6 g/dL (ref 30.0–36.0)
MCV: 87.9 fL (ref 80.0–100.0)
Platelets: 206 10*3/uL (ref 150–400)
RBC: 5.06 MIL/uL (ref 4.22–5.81)
RDW: 13.9 % (ref 11.5–15.5)
WBC: 6.7 10*3/uL (ref 4.0–10.5)
nRBC: 0 % (ref 0.0–0.2)

## 2019-07-09 MED ORDER — VALACYCLOVIR HCL 1 G PO TABS: 1000.0000 mg | ORAL_TABLET | Freq: Three times a day (TID) | ORAL | 0 refills | Status: DC

## 2019-07-09 MED ORDER — HYDROCODONE-ACETAMINOPHEN 5-325 MG PO TABS: 1.0000 | ORAL_TABLET | Freq: Four times a day (QID) | ORAL | 0 refills | Status: DC | PRN

## 2019-07-09 MED ORDER — SODIUM CHLORIDE 0.9% FLUSH: 3.0000 mL | Freq: Once | INTRAVENOUS | Status: DC

## 2019-07-09 NOTE — ED Provider Notes (Signed)
[facility]ERGENCY DEPARTMENT Provider Note   CSN: [id number] Arrival date & time: 07/09/19  0344     History   Chief Complaint Chief Complaint  Patient presents with  . Hypertension  . Arm Pain    HPI Troy Bird is a 63 y.o. male.     HPI Patient presents with chest pain arm pain and feeling bad.  Is felt bad for the last around 3 to 4 days.  Not exertional pain.  No fevers.  States he was unable to sleep last night due to the pain.  History of hypertension blood pressure is elevated here.  States he is been taking his medicines.  States he developed a rash a couple days ago also in the area.  Denies having chickenpox as a child. Past Medical History:  Diagnosis Date  . Alcohol abuse   . Colon polyps   . Hx of blood clots   . Hyperlipidemia   . Hypertension   . Phlebitis     Patient Active Problem List   Diagnosis Date Noted  . Compliance poor 08/28/2017  . 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 Left leg pain 06/20/2017  . Nocturia 04/18/2017  . Routine general medical examination at a health care facility 10/04/2016  . Low back pain 06/22/2016  . Cigarette smoker 11/09/2015  . Gout 11/09/2015  . Essential hypertension 09/01/2015  . Neuropathic pain 09/01/2015  . History of DVT of lower extremity 09/01/2015  . Hyperlipidemia 09/01/2015    Past Surgical History:  Procedure Laterality Date  . LEG SURGERY Right 2002  . TRACHEAL ESOPHAGEAL PUNCTURE REPAIR  2002        Home Medications    Prior to Admission medications   Medication Sig Start Date End Date Taking? Authorizing Provider  allopurinol (ZYLOPRIM) 300 MG tablet Take 1 tablet (300 mg total) by mouth 2 (two) times daily. 10/04/16   Hoyt Koch, MD  amLODipine (NORVASC) 10 MG tablet Take 1 tablet (10 mg total) by mouth daily. 06/20/17   Hoyt Koch, MD  Colchicine 0.6 MG CAPS Take 1  capsule by mouth daily. Reported on 11/09/2015 06/29/15   [provider]  gabapentin (NEURONTIN) 300 MG capsule Take 1-2 capsules (300-600 mg total) by mouth 3 (three) times daily as needed. 07/25/17   Hoyt Koch, MD  gabapentin (NEURONTIN) 300 MG capsule TAKE 1-2 CAPSULES (300-600 MG TOTAL) BY MOUTH AT BEDTIME. 11/13/17   Hoyt Koch, MD  HYDROcodone-acetaminophen (NORCO/VICODIN) 5-325 MG tablet Take 1-2 tablets by mouth every 6 (six) hours as needed. 07/09/19   Davonna Belling, MD  lidocaine (LIDODERM) 5 % Place 1 patch onto the skin daily. Remove & Discard patch within 12 hours or as directed by MD 09/12/17   Hoyt Koch, MD  pentoxifylline (TRENTAL) 400 MG CR tablet Take 1 tablet (400 mg total) by mouth 3 (three) times daily with meals. 07/31/17   Newt Minion, MD  pravastatin (PRAVACHOL) 40 MG tablet Take 1 tablet (40 mg total) by mouth daily. Need annual visit with labs for further refills 10/13/17   Hoyt Koch, MD  traMADol (ULTRAM) 50 MG tablet Take 1 tablet (50 mg total) by mouth every 12 (twelve) hours as needed. 11/17/17   Marrian Salvage, FNP  valACYclovir (VALTREX) 1000 MG tablet Take 1 tablet (1,000 mg total) by mouth 3 (three) times daily. 07/09/19   Davonna Belling, MD  Family History Family History  Problem Relation Age of Onset  . Arthritis Mother   . Hypertension Mother   . Alcohol abuse Father   . Hypertension Father   . Diabetes Father   . Heart disease Father     Social History Social History   Tobacco Use  . Smoking status: Current Some Day Smoker    Packs/day: 0.25    Years: 57.00    Pack years: 14.25    Types: Cigarettes  . Smokeless tobacco: Never Used  Substance Use Topics  . Alcohol use: No    Alcohol/week: 0.0 standard drinks  . Drug use: No     Allergies   Lyrica [pregabalin]   Review of Systems Review of Systems  Constitutional: Positive for fatigue. Negative for appetite change and  fever.  HENT: Negative for congestion.   Respiratory: Negative for shortness of breath.   Cardiovascular: Positive for chest pain.  Gastrointestinal: Negative for abdominal pain.  Genitourinary: Negative for flank pain.  Musculoskeletal: Negative for back pain.  Skin: Positive for rash.  Neurological: Negative for weakness.  Psychiatric/Behavioral: Negative for confusion.     Physical Exam Updated Vital Signs BP (!) 219/128 (BP Location: Right Arm)   Pulse 77   Temp 98.4 F (36.9 C) (Oral)   Resp 20   Ht 5\' 11"  (1.803 m)   Wt 81.2 kg   SpO2 98%   BMI 24.97 kg/m   Physical Exam Vitals signs and nursing note reviewed.  HENT:     Head: Atraumatic.  Neck:     Musculoskeletal: Neck supple.  Cardiovascular:     Rate and Rhythm: Regular rhythm.  Chest:     Chest wall: Tenderness present.  Abdominal:     Tenderness: There is no abdominal tenderness.  Musculoskeletal:        General: No tenderness.  Skin:    Capillary Refill: Capillary refill takes less than 2 seconds.     Comments: Patient with blistering somewhat erythematous rash on left upper chest progressing linearly laterally to underside of arm and around his back.  Neurological:     Mental Status: He is alert and oriented to person, place, and time.      ED Treatments / Results  Labs (all labs ordered are listed, but only abnormal results are displayed) Labs Reviewed  BASIC METABOLIC PANEL - Abnormal; Notable for the following components:      Result Value   Potassium 3.2 (*)    Glucose, Bld 140 (*)    Creatinine, Ser 1.37 (*)    Calcium 6.7 (*)    GFR calc non Af Amer 55 (*)    All other components within normal limits  CBC  TROPONIN I (HIGH SENSITIVITY)    EKG EKG Interpretation  Date/Time:  Tuesday July 09 2019 04:21:26 EDT Ventricular Rate:  81 PR Interval:  156 QRS Duration: 84 QT Interval:  406 QTC Calculation: 471 R Axis:   -38 Text Interpretation:  Normal sinus rhythm Biatrial  enlargement Left axis deviation Abnormal ECG Confirmed by Benjiman CorePickering, Rosaland Shiffman [phone]([id number]) on 07/09/2019 9:06:12 AM   Radiology Dg Chest 2 View  Result Date: 07/09/2019 CLINICAL DATA:  Chest and arm pain EXAM: CHEST - 2 VIEW COMPARISON:  None. FINDINGS: Normal heart size. Mild aortic tortuosity. There is no edema, consolidation, effusion, or pneumothorax. Degenerative endplate spurring. IMPRESSION: No active cardiopulmonary disease. Electronically Signed   By: Marnee SpringJonathon  Watts M.D.   On: 07/09/2019 04:59    Procedures Procedures (including critical care time)  Medications Ordered in ED Medications  sodium chloride flush (NS) 0.9 % injection 3 mL (has no administration in time range)     Initial Impression / Assessment and Plan / ED Course  I have reviewed the triage vital signs and the nursing notes.  Pertinent labs & imaging results that were available during my care of the patient were reviewed by me and considered in my medical decision making (see chart for details).        Patient with chest and arm pain. Hypertension. Pain is likely secondary to zoster. Has rash of shingles but does not knowingly have history of varicella.  However with the rash I think we can treat this as shingles.  Does not have clear endorgan damage.  I think blood pressure improved with pain control.  States his been compliant with medicines.  Discharge home.  Final Clinical Impressions(s) / ED Diagnoses   Final diagnoses:  Herpes zoster without complication  Hypertension, unspecified type    ED Discharge Orders         Ordered    valACYclovir (VALTREX) 1000 MG tablet  3 times daily     07/09/19 0919    HYDROcodone-acetaminophen (NORCO/VICODIN) 5-325 MG tablet  Every 6 hours PRN     07/09/19 0919           Benjiman Core, MD 07/09/19 [phone]

## 2019-07-09 NOTE — ED Notes (Signed)
Pt has blisters from scapula to sternum -- severe pain - unable to sleep--

## 2019-07-09 NOTE — ED Triage Notes (Signed)
Patient is hypertensive, states that he just does not feel right.  He states that he has pain at the back of his left arm.  He denies any chest pain at this time.  No nausea or vomiting.

## 2019-07-11 ENCOUNTER — Emergency Department (HOSPITAL_COMMUNITY)
Admission: EM | Admit: 2019-07-11 | Discharge: 2019-07-11 | Disposition: A | Discharge status: Home / Self Care | Payer: BLUE CROSS/BLUE SHIELD | Attending: Emergency Medicine | Admitting: Emergency Medicine

## 2019-07-11 ENCOUNTER — Telehealth: Payer: Self-pay

## 2019-07-11 ENCOUNTER — Other Ambulatory Visit: Payer: Self-pay

## 2019-07-11 ENCOUNTER — Encounter (HOSPITAL_COMMUNITY): Payer: Self-pay | Admitting: Emergency Medicine

## 2019-07-11 DIAGNOSIS — B029 Zoster without complications: Secondary | ICD-10-CM | POA: Insufficient documentation

## 2019-07-11 DIAGNOSIS — I10 Essential (primary) hypertension: Secondary | ICD-10-CM | POA: Insufficient documentation

## 2019-07-11 DIAGNOSIS — F17210 Nicotine dependence, cigarettes, uncomplicated: Secondary | ICD-10-CM | POA: Diagnosis not present

## 2019-07-11 DIAGNOSIS — Z79899 Other long term (current) drug therapy: Secondary | ICD-10-CM | POA: Insufficient documentation

## 2019-07-11 DIAGNOSIS — R21 Rash and other nonspecific skin eruption: Secondary | ICD-10-CM | POA: Diagnosis not present

## 2019-07-11 LAB — CBC
HCT: 44.3 % (ref 39.0–52.0)
Hemoglobin: 14.9 g/dL (ref 13.0–17.0)
MCH: 30 pg (ref 26.0–34.0)
MCHC: 33.6 g/dL (ref 30.0–36.0)
MCV: 89.1 fL (ref 80.0–100.0)
Platelets: 204 10*3/uL (ref 150–400)
RBC: 4.97 MIL/uL (ref 4.22–5.81)
RDW: 14 % (ref 11.5–15.5)
WBC: 7.2 10*3/uL (ref 4.0–10.5)
nRBC: 0 % (ref 0.0–0.2)

## 2019-07-11 LAB — BASIC METABOLIC PANEL
Anion gap: 11 (ref 5–15)
BUN: 10 mg/dL (ref 8–23)
CO2: 28 mmol/L (ref 22–32)
Calcium: 6.5 mg/dL — ABNORMAL LOW (ref 8.9–10.3)
Chloride: 99 mmol/L (ref 98–111)
Creatinine, Ser: 1.38 mg/dL — ABNORMAL HIGH (ref 0.61–1.24)
GFR calc Af Amer: >60 mL/min (ref >60)
GFR calc non Af Amer: 54 mL/min — ABNORMAL LOW (ref >60)
Glucose, Bld: 120 mg/dL — ABNORMAL HIGH (ref 70–99)
Potassium: 3.3 mmol/L — ABNORMAL LOW (ref 3.5–5.1)
Sodium: 138 mmol/L (ref 135–145)

## 2019-07-11 LAB — MAGNESIUM: Magnesium: 1.8 mg/dL (ref 1.7–2.4)

## 2019-07-11 LAB — PHOSPHORUS: Phosphorus: 3.8 mg/dL (ref 2.5–4.6)

## 2019-07-11 MED ORDER — LIDOCAINE 5 % EX PTCH: 1.0000 | MEDICATED_PATCH | CUTANEOUS | 0 refills | Status: DC

## 2019-07-11 MED ORDER — OXYCODONE-ACETAMINOPHEN 5-325 MG PO TABS
2.0000 | ORAL_TABLET | Freq: Once | ORAL | Status: AC
  Administered 2019-07-11: 2 via ORAL
  Filled 2019-07-11: qty 2

## 2019-07-11 MED ORDER — CALCIUM GLUCONATE 500 MG PO TABS: 1.0000 | ORAL_TABLET | Freq: Three times a day (TID) | ORAL | 0 refills | Status: DC

## 2019-07-11 MED ORDER — GABAPENTIN 300 MG PO CAPS: 300.0000 mg | ORAL_CAPSULE | Freq: Every day | ORAL | 0 refills | Status: DC

## 2019-07-11 MED ORDER — HYDROCODONE-ACETAMINOPHEN 5-325 MG PO TABS: 1.0000 | ORAL_TABLET | Freq: Four times a day (QID) | ORAL | 0 refills | Status: DC | PRN

## 2019-07-11 NOTE — Telephone Encounter (Signed)
Agree should have appointment

## 2019-07-11 NOTE — ED Triage Notes (Signed)
Pt reports severe left arm pain due to shingles rash. Also reports he feels weak and like his blood pressure might be high. Pt reports pain is 10/10. bp currently 205/116. Denies headache, vision changes.

## 2019-07-11 NOTE — ED Provider Notes (Signed)
[facility] Provider Note   CSN: [id number] Arrival date & time: 07/11/19  [id number]     History   Chief Complaint Chief Complaint  Patient presents with  . Rash    HPI Troy Bird is a 63 y.o. male.     HPI Patient presents with pain from his shingles.  Seen by me in the ER a couple days ago.  Had pain on his left chest and found to have a rash.  Appear to be shingles.  Very tender.  Had been given hydrocodone for pain.  States he used them all and continues to have pain.  Pain is relieved somewhat with the pills.  He is also worried that his blood pressure is high and continues to be high.  No chest pain besides the rash.  States he does have some tingling in his fingers at times.  Dr. Hillard DankerElizabeth Crawford is his primary care doctor.  Patient states he is on calcium supplementation, however I do not see this in his medication list. Past Medical History:  Diagnosis Date  . Alcohol abuse   . Colon polyps   . Hx of blood clots   . Hyperlipidemia   . Hypertension   . Phlebitis     Patient Active Problem List   Diagnosis Date Noted  . Compliance poor 08/28/2017  . 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 Left leg pain 06/20/2017  . Nocturia 04/18/2017  . Routine general medical examination at a health care facility 10/04/2016  . Low back pain 06/22/2016  . Cigarette smoker 11/09/2015  . Gout 11/09/2015  . Essential hypertension 09/01/2015  . Neuropathic pain 09/01/2015  . History of DVT of lower extremity 09/01/2015  . Hyperlipidemia 09/01/2015    Past Surgical History:  Procedure Laterality Date  . LEG SURGERY Right 2002  . TRACHEAL ESOPHAGEAL PUNCTURE REPAIR  2002        Home Medications    Prior to Admission medications   Medication Sig Start Date End Date Taking? Authorizing Provider  allopurinol (ZYLOPRIM) 300 MG tablet Take 1 tablet (300 mg total)  by mouth 2 (two) times daily. 10/04/16   Myrlene Brokerrawford, Elizabeth A, MD  amLODipine (NORVASC) 10 MG tablet Take 1 tablet (10 mg total) by mouth daily. 06/20/17   Myrlene Brokerrawford, Elizabeth A, MD  calcium gluconate 500 MG tablet Take 1 tablet (500 mg total) by mouth 3 (three) times daily. 07/11/19   Benjiman CorePickering, Stonewall Doss, MD  Colchicine 0.6 MG CAPS Take 1 capsule by mouth daily. Reported on 11/09/2015 06/29/15   [provider]  gabapentin (NEURONTIN) 300 MG capsule Take 1-2 capsules (300-600 mg total) by mouth at bedtime. 07/11/19   Benjiman CorePickering, Zaydenn Balaguer, MD  HYDROcodone-acetaminophen (NORCO/VICODIN) 5-325 MG tablet Take 1-2 tablets by mouth every 6 (six) hours as needed. 07/11/19   Benjiman CorePickering, Exilda Wilhite, MD  lidocaine (LIDODERM) 5 % Place 1 patch onto the skin daily. Remove & Discard patch within 12 hours or as directed by MD 07/11/19   Benjiman CorePickering, Ziyon Soltau, MD  pentoxifylline (TRENTAL) 400 MG CR tablet Take 1 tablet (400 mg total) by mouth 3 (three) times daily with meals. 07/31/17   Nadara Mustarduda, Marcus V, MD  pravastatin (PRAVACHOL) 40 MG tablet Take 1 tablet (40 mg total) by mouth daily. Need annual visit with labs for further refills 10/13/17   Myrlene Brokerrawford, Elizabeth A, MD  traMADol (ULTRAM) 50 MG tablet Take 1 tablet (50 mg total) by mouth every 12 (  twelve) hours as needed. 11/17/17   Marrian Salvage, FNP  valACYclovir (VALTREX) 1000 MG tablet Take 1 tablet (1,000 mg total) by mouth 3 (three) times daily. 07/09/19   Davonna Belling, MD    Family History Family History  Problem Relation Age of Onset  . Arthritis Mother   . Hypertension Mother   . Alcohol abuse Father   . Hypertension Father   . Diabetes Father   . Heart disease Father     Social History Social History   Tobacco Use  . Smoking status: Current Some Day Smoker    Packs/day: 0.25    Years: 57.00    Pack years: 14.25    Types: Cigarettes  . Smokeless tobacco: Never Used  Substance Use Topics  . Alcohol use: No    Alcohol/week: 0.0 standard  drinks  . Drug use: No     Allergies   Lyrica [pregabalin]   Review of Systems Review of Systems  Constitutional: Negative for appetite change.  HENT: Negative for congestion.   Respiratory: Negative for shortness of breath.   Cardiovascular: Positive for chest pain.  Gastrointestinal: Negative for abdominal pain.  Genitourinary: Negative for flank pain.  Musculoskeletal: Negative for back pain.  Neurological:       Tingling in hands.  Psychiatric/Behavioral: Negative for confusion.     Physical Exam Updated Vital Signs BP (!) 166/99   Pulse 70   Temp 98.2 F (36.8 C) (Oral)   Resp 18   Ht 5\' 11"  (1.803 m)   Wt 81.2 kg   SpO2 96%   BMI 24.97 kg/m   Physical Exam Vitals signs and nursing note reviewed.  HENT:     Head: Normocephalic.  Cardiovascular:     Rate and Rhythm: Regular rhythm.     Heart sounds: No murmur.  Pulmonary:     Breath sounds: Normal breath sounds.  Abdominal:     Tenderness: There is no abdominal tenderness.  Skin:    Capillary Refill: Capillary refill takes less than 2 seconds.     Comments: Vesicular rash along left upper chest linearly out to left arm.  Neurological:     Mental Status: He is alert and oriented to person, place, and time.      ED Treatments / Results  Labs (all labs ordered are listed, but only abnormal results are displayed) Labs Reviewed  BASIC METABOLIC PANEL - Abnormal; Notable for the following components:      Result Value   Potassium 3.3 (*)    Glucose, Bld 120 (*)    Creatinine, Ser 1.38 (*)    Calcium 6.5 (*)    GFR calc non Af Amer 54 (*)    All other components within normal limits  CBC  PARATHYROID HORMONE, INTACT (NO CA)  CALCITRIOL (1,25 DI-OH VIT D)  MAGNESIUM  PHOSPHORUS    EKG None  Radiology No results found.  Procedures Procedures (including critical care time)  Medications Ordered in ED Medications  oxyCODONE-acetaminophen (PERCOCET/ROXICET) 5-325 MG per tablet 2 tablet (2  tablets Oral Given 07/11/19 0753)     Initial Impression / Assessment and Plan / ED Course  I have reviewed the triage vital signs and the nursing notes.  Pertinent labs & imaging results that were available during my care of the patient were reviewed by me and considered in my medical decision making (see chart for details).        Patient with pain from shingles.  Somewhat managed on Norco but has used  the pills.  Previous history of neuropathy.  Will add Lidoderm patch and Neurontin.  Patient has been on and sees but not recently.  Discussed with patient's PCPs office, Dr. Hillard Danker.  We will follow-up as an outpatient.  He will call for appointment.  I have added some labs on to help evaluate the hypocalcemia that appears to be chronic.  Has some tingling in fingers but otherwise appears to be mostly asymptomatic.  Will discharge home.  Final Clinical Impressions(s) / ED Diagnoses   Final diagnoses:  Herpes zoster without complication  Hypocalcemia  Hypertension, unspecified type    ED Discharge Orders         Ordered    gabapentin (NEURONTIN) 300 MG capsule  Daily at bedtime     07/11/19 1049    lidocaine (LIDODERM) 5 %  Every 24 hours     07/11/19 1049    HYDROcodone-acetaminophen (NORCO/VICODIN) 5-325 MG tablet  Every 6 hours PRN     07/11/19 1049    calcium gluconate 500 MG tablet  3 times daily     07/11/19 1049           Benjiman Core, MD 07/11/19 1052

## 2019-07-11 NOTE — Discharge Instructions (Signed)
Follow-up with Dr. Sharlet Salina.  Call for appointment.  You have had some lab tests done here that she can follow.

## 2019-07-11 NOTE — Telephone Encounter (Signed)
Can you make patient a follow up ED appointment with Dr. Sharlet Salina. Thank you

## 2019-07-11 NOTE — ED Notes (Signed)
ED Provider at bedside. 

## 2019-07-11 NOTE — Telephone Encounter (Signed)
FYI Dr. Davonna Belling called and wanted to let us know that patient has shingles on left chest and down the arm and patient is having pain. They are treating for that. States gabapentin and lidocaine patches.   Also calling to let us know that patients blood pressure is really high and calcium is low. Calcium 6.5 today, BP is at 160/100 today 203/100 yesterday. Patient states that he is taking his medication. Complaining of tingling in both hands at times.  Dr. Alvino Chapel will also re-check the parathyroid, B-12/ and Vitamin D.  I let him know it has been two years since you have seen him so patient would need to make an appointment with [place].

## 2019-07-12 LAB — CALCITRIOL (1,25 DI-OH VIT D): Vit D, 1,25-Dihydroxy: 30.1 pg/mL (ref 19.9–79.3)

## 2019-07-12 LAB — PARATHYROID HORMONE, INTACT (NO CA): PTH: 29 pg/mL (ref 15–65)

## 2019-07-12 NOTE — Telephone Encounter (Signed)
Pt scheduled  

## 2019-07-15 ENCOUNTER — Ambulatory Visit (INDEPENDENT_AMBULATORY_CARE_PROVIDER_SITE_OTHER): Payer: BLUE CROSS/BLUE SHIELD | Admitting: Internal Medicine

## 2019-07-15 ENCOUNTER — Other Ambulatory Visit: Payer: Self-pay

## 2019-07-15 ENCOUNTER — Encounter: Payer: Self-pay | Admitting: Internal Medicine

## 2019-07-15 ENCOUNTER — Other Ambulatory Visit (INDEPENDENT_AMBULATORY_CARE_PROVIDER_SITE_OTHER): Payer: BLUE CROSS/BLUE SHIELD

## 2019-07-15 VITALS — BP 168/112 | HR 98 | Temp 98.4°F | Ht 71.0 in | Wt 169.0 lb

## 2019-07-15 DIAGNOSIS — B029 Zoster without complications: Secondary | ICD-10-CM | POA: Diagnosis not present

## 2019-07-15 DIAGNOSIS — Z23 Encounter for immunization: Secondary | ICD-10-CM | POA: Diagnosis not present

## 2019-07-15 DIAGNOSIS — I10 Essential (primary) hypertension: Secondary | ICD-10-CM | POA: Diagnosis not present

## 2019-07-15 LAB — COMPREHENSIVE METABOLIC PANEL
ALT: 8 U/L (ref 0–53)
AST: 17 U/L (ref 0–37)
Albumin: 4.4 g/dL (ref 3.5–5.2)
Alkaline Phosphatase: 112 U/L (ref 39–117)
BUN: 9 mg/dL (ref 6–23)
CO2: 32 mEq/L (ref 19–32)
Calcium: 7.6 mg/dL — ABNORMAL LOW (ref 8.4–10.5)
Chloride: 97 mEq/L (ref 96–112)
Creatinine, Ser: 1.25 mg/dL (ref 0.40–1.50)
GFR: 70.57 mL/min (ref >60.00)
Glucose, Bld: 109 mg/dL — ABNORMAL HIGH (ref 70–99)
Potassium: 3.4 mEq/L — ABNORMAL LOW (ref 3.5–5.1)
Sodium: 138 mEq/L (ref 135–145)
Total Bilirubin: 0.6 mg/dL (ref 0.2–1.2)
Total Protein: 8.7 g/dL — ABNORMAL HIGH (ref 6.0–8.3)

## 2019-07-15 LAB — LIPID PANEL
Cholesterol: 250 mg/dL — ABNORMAL HIGH (ref 0–200)
HDL: 43.4 mg/dL (ref >39.00)
LDL Cholesterol: 181 mg/dL — ABNORMAL HIGH (ref 0–99)
NonHDL: 206.61
Total CHOL/HDL Ratio: 6
Triglycerides: 127 mg/dL (ref 0.0–149.0)
VLDL: 25.4 mg/dL (ref 0.0–40.0)

## 2019-07-15 LAB — HEMOGLOBIN A1C: Hgb A1c MFr Bld: 5.4 % (ref 4.6–6.5)

## 2019-07-15 MED ORDER — VALACYCLOVIR HCL 1 G PO TABS: 1000.0000 mg | ORAL_TABLET | Freq: Three times a day (TID) | ORAL | 0 refills | Status: DC

## 2019-07-15 MED ORDER — HYDROCODONE-ACETAMINOPHEN 5-325 MG PO TABS: 1.0000 | ORAL_TABLET | Freq: Two times a day (BID) | ORAL | 0 refills | Status: DC | PRN

## 2019-07-15 MED ORDER — AMLODIPINE BESYLATE 10 MG PO TABS: 10.0000 mg | ORAL_TABLET | Freq: Every day | ORAL | 0 refills | Status: DC

## 2019-07-15 NOTE — Progress Notes (Signed)
   Subjective:   Patient ID: Troy Bird, male    DOB: 11/21/1955, 63 y.o.   MRN: [id number]  HPI The patient is a 63 YO man coming in for ER follow up (in for shingles and severe HTN, not treated for HTN but given valtrex and gabapentin and hydrocodone and lidoderm for the shingles, was in twice). He has not been seen for several years for routine care and previously was on amlodipine for BP but has been out for some time. Denies headaches. He denies ever getting rx for valtrex (we did call his pharmacy and confirmed that they never got rx for valtrex and this was not filled). He is suffering from severe 10/10 pain in the left chest wall and arm. He is having the rash to go away slightly. Denies new symptoms. Out of BP meds for a long time. Denies abdominal pain or nausea or vomiting.   PMH, [facility], social history reviewed and updated.   Review of Systems  Constitutional: Positive for activity change and appetite change.  HENT: Negative.   Eyes: Negative.   Respiratory: Positive for chest tightness. Negative for cough and shortness of breath.   Cardiovascular: Positive for chest pain. Negative for palpitations and leg swelling.  Gastrointestinal: Negative for abdominal distention, abdominal pain, constipation, diarrhea, nausea and vomiting.  Musculoskeletal: Negative.   Skin: Negative.   Neurological: Negative.   Psychiatric/Behavioral: Negative.     Objective:  Physical Exam Constitutional:      Appearance: He is well-developed.  HENT:     Head: Normocephalic and atraumatic.  Neck:     Musculoskeletal: Normal range of motion.  Cardiovascular:     Rate and Rhythm: Normal rate and regular rhythm.  Pulmonary:     Effort: Pulmonary effort is normal. No respiratory distress.     Breath sounds: Normal breath sounds. No wheezing or rales.  Abdominal:     General: Bowel sounds are normal. There is no distension.     Palpations: Abdomen is soft.     Tenderness: There is no abdominal  tenderness. There is no rebound.  Skin:    General: Skin is warm and dry.     Findings: Rash present.     Comments: Rash consistent with shingles on the left chest wall and left arm. Tender to touch, blisters are covered at this time.   Neurological:     Mental Status: He is alert and oriented to person, place, and time.     Coordination: Coordination normal.     Vitals:   07/15/19 1102  BP: (!) 182/120  Pulse: 98  Temp: 98.4 F (36.9 C)  TempSrc: Oral  SpO2: 99%  Weight: 169 lb (76.7 kg)  Height: 5\' 11"  (1.803 m)    Assessment & Plan:  Flu shot given at visit

## 2019-07-15 NOTE — Patient Instructions (Addendum)
We are checking the labs today to check on the calcium level.   We have sent in a refill of the pain medication to take up to twice a day as needed.   We have sent in the blood pressure medicine amlodipine to start taking 1 pill daily and need to see you back in 2-3 weeks to see how the blood pressure is doing.   We have sent in a medicine called valtrex to take which fights against the shingles and is very important to take. Take 1 pill 3 times a day for 1 week.    Shingles  Shingles is an infection. It gives you a painful skin rash and blisters that have fluid in them. Shingles is caused by the same germ (virus) that causes chickenpox. Shingles only happens in people who:  Have had chickenpox.  Have been given a shot of medicine (vaccine) to protect against chickenpox. Shingles is rare in this group. The first symptoms of shingles may be itching, tingling, or pain in an area on your skin. A rash will show on your skin a few days or weeks later. The rash is likely to be on one side of your body. The rash usually has a shape like a belt or a band. Over time, the rash turns into fluid-filled blisters. The blisters will break open, change into scabs, and dry up. Medicines may:  Help with pain and itching.  Help you get better sooner.  Help to prevent long-term problems. Follow these instructions at home: Medicines  Take over-the-counter and prescription medicines only as told by your doctor.  Put on an anti-itch cream or numbing cream where you have a rash, blisters, or scabs. Do this as told by your doctor. Helping with itching and discomfort   Put cold, wet cloths (cold compresses) on the area of the rash or blisters as told by your doctor.  Cool baths can help you feel better. Try adding baking soda or dry oatmeal to the water to lessen itching. Do not bathe in hot water. Blister and rash care  Keep your rash covered with a loose bandage (dressing).  Wear loose clothing that  does not rub on your rash.  Keep your rash and blisters clean. To do this, wash the area with mild soap and cool water as told by your doctor.  Check your rash every day for signs of infection. Check for: ? More redness, swelling, or pain. ? Fluid or blood. ? Warmth. ? Pus or a bad smell.  Do not scratch your rash. Do not pick at your blisters. To help you to not scratch: ? Keep your fingernails clean and cut short. ? Wear gloves or mittens when you sleep, if scratching is a problem. General instructions  Rest as told by your doctor.  Keep all follow-up visits as told by your doctor. This is important.  Wash your hands often with soap and water. If soap and water are not available, use hand sanitizer. Doing this lowers your chance of getting a skin infection caused by germs (bacteria).  Your infection can cause chickenpox in people who have never had chickenpox or never got a shot of chickenpox vaccine. If you have blisters that did not change into scabs yet, try not to touch other people or be around other people, especially: ? Babies. ? Pregnant women. ? Children who have areas of red, itchy, or rough skin (eczema). ? Very old people who have transplants. ? People who have a long-term (chronic)  sickness, like cancer or AIDS. Contact a doctor if:  Your pain does not get better with medicine.  Your pain does not get better after the rash heals.  You have any signs of infection in the rash area. These signs include: ? More redness, swelling, or pain around the rash. ? Fluid or blood coming from the rash. ? The rash area feeling warm to the touch. ? Pus or a bad smell coming from the rash. Get help right away if:  The rash is on your face or nose.  You have pain in your face or pain by your eye.  You lose feeling on one side of your face.  You have trouble seeing.  You have ear pain, or you have ringing in your ear.  You have a loss of taste.  Your condition gets  worse. Summary  Shingles gives you a painful skin rash and blisters that have fluid in them.  Shingles is an infection. It is caused by the same germ (virus) that causes chickenpox.  Keep your rash covered with a loose bandage (dressing). Wear loose clothing that does not rub on your rash.  If you have blisters that did not change into scabs yet, try not to touch other people or be around people. This information is not intended to replace advice given to you by your health care provider. Make sure you discuss any questions you have with your health care provider. Document Released: 03/14/2008 Document Revised: 01/18/2019 Document Reviewed: 05/31/2017 Elsevier Patient Education  2020 [employer].

## 2019-07-16 ENCOUNTER — Encounter: Payer: Self-pay | Admitting: Internal Medicine

## 2019-07-16 DIAGNOSIS — B029 Zoster without complications: Secondary | ICD-10-CM | POA: Insufficient documentation

## 2019-07-16 LAB — PTH, INTACT AND CALCIUM
Calcium: 7.2 mg/dL — ABNORMAL LOW (ref 8.6–10.3)
PTH: 39 pg/mL (ref 14–64)

## 2019-07-16 NOTE — Assessment & Plan Note (Signed)
Restart amlodipine 10 mg daily and likely BP is higher due to pain today. Will see him back in 2-3 weeks for BP check.

## 2019-07-16 NOTE — Assessment & Plan Note (Signed)
I do not actually see an ionized calcium and albumin to help assess cause or treatment. Levels were very low in the ER both times. Preliminary workup in ER without obvious cause but missing some tests which are ordered today.

## 2019-07-16 NOTE — Assessment & Plan Note (Signed)
He has not been treated appropriately. Rx valtrex since this did not transmit. Refill hydrocodone as the gabapentin is not helping, lidoderm patches are helping some and will continue. Allergy to lyrica.

## 2019-07-29 ENCOUNTER — Other Ambulatory Visit (INDEPENDENT_AMBULATORY_CARE_PROVIDER_SITE_OTHER): Payer: BLUE CROSS/BLUE SHIELD

## 2019-07-29 ENCOUNTER — Ambulatory Visit (INDEPENDENT_AMBULATORY_CARE_PROVIDER_SITE_OTHER): Payer: BLUE CROSS/BLUE SHIELD | Admitting: Internal Medicine

## 2019-07-29 ENCOUNTER — Other Ambulatory Visit: Payer: Self-pay

## 2019-07-29 ENCOUNTER — Encounter: Payer: Self-pay | Admitting: Internal Medicine

## 2019-07-29 DIAGNOSIS — E782 Mixed hyperlipidemia: Secondary | ICD-10-CM | POA: Diagnosis not present

## 2019-07-29 DIAGNOSIS — I10 Essential (primary) hypertension: Secondary | ICD-10-CM | POA: Diagnosis not present

## 2019-07-29 DIAGNOSIS — B029 Zoster without complications: Secondary | ICD-10-CM | POA: Diagnosis not present

## 2019-07-29 LAB — RENAL FUNCTION PANEL
Albumin: 4.1 g/dL (ref 3.5–5.2)
BUN: 10 mg/dL (ref 6–23)
CO2: 31 mEq/L (ref 19–32)
Calcium: 7.3 mg/dL — ABNORMAL LOW (ref 8.4–10.5)
Chloride: 101 mEq/L (ref 96–112)
Creatinine, Ser: 1.26 mg/dL (ref 0.40–1.50)
GFR: 69.92 mL/min (ref >60.00)
Glucose, Bld: 125 mg/dL — ABNORMAL HIGH (ref 70–99)
Phosphorus: 3.8 mg/dL (ref 2.3–4.6)
Potassium: 4 mEq/L (ref 3.5–5.1)
Sodium: 139 mEq/L (ref 135–145)

## 2019-07-29 MED ORDER — CALCIUM CARBONATE-VITAMIN D 500-200 MG-UNIT PO TABS: 1.0000 | ORAL_TABLET | Freq: Two times a day (BID) | ORAL | 3 refills | Status: DC

## 2019-07-29 MED ORDER — HYDROCODONE-ACETAMINOPHEN 5-325 MG PO TABS: 1.0000 | ORAL_TABLET | Freq: Every evening | ORAL | 0 refills | Status: DC | PRN

## 2019-07-29 MED ORDER — PRAVASTATIN SODIUM 40 MG PO TABS: 40.0000 mg | ORAL_TABLET | Freq: Every day | ORAL | 3 refills | Status: DC

## 2019-07-29 NOTE — Assessment & Plan Note (Signed)
Rx pravastatin today due to high LDL on recent labs.

## 2019-07-29 NOTE — Progress Notes (Signed)
   Subjective:   Patient ID: Troy Bird, male    DOB: 11/12/1955, 63 y.o.   MRN: [id number]  HPI The patient is a 63 YO man coming in for follow up shingles (took valtrex after las visit and this has helped some, pain is now mostly at night time and not during the day, there is a patch of numbness in the left armpit region, rash is improving but not gone, using gabapentin in the evening which is not helping, disrupting his sleep, vicodin did help and used only at night time but is now out of this) and blood pressure (taking amlodipine 10 mg daily, denies side effects, denies chest pains outside of shingles or headaches, denies swelling) and low calcium (labs previously indicated likely primary hypoparathyroidism, no prior surgery or radiation, not taking anything, previously was on calcitriol about 4 years ago with uncertain history of perhaps hyperparathyroidism at that time).   Review of Systems  Constitutional: Negative.   HENT: Negative.   Eyes: Negative.   Respiratory: Negative for cough, chest tightness and shortness of breath.   Cardiovascular: Negative for chest pain, palpitations and leg swelling.  Gastrointestinal: Negative for abdominal distention, abdominal pain, constipation, diarrhea, nausea and vomiting.  Musculoskeletal: Positive for myalgias.  Skin: Negative.   Neurological: Positive for numbness.  Psychiatric/Behavioral: Negative.     Objective:  Physical Exam Constitutional:      Appearance: He is well-developed.  HENT:     Head: Normocephalic and atraumatic.  Neck:     Musculoskeletal: Normal range of motion.  Cardiovascular:     Rate and Rhythm: Normal rate and regular rhythm.  Pulmonary:     Effort: Pulmonary effort is normal. No respiratory distress.     Breath sounds: Normal breath sounds. No wheezing or rales.  Abdominal:     General: Bowel sounds are normal. There is no distension.     Palpations: Abdomen is soft.     Tenderness: There is no abdominal  tenderness. There is no rebound.  Skin:    General: Skin is warm and dry.  Neurological:     Mental Status: He is alert and oriented to person, place, and time.     Coordination: Coordination normal.     Comments: Decreased sensation left armpit region, rash is improving still no open blisters anymore     Vitals:   07/29/19 1038 07/29/19 1103  BP: (!) 130/96 (!) 142/96  Pulse: 87   Temp: 97.8 F (36.6 C)   TempSrc: Oral   SpO2: 98%   Weight: 171 lb (77.6 kg)   Height: 5\' 11"  (1.803 m)     Assessment & Plan:

## 2019-07-29 NOTE — Assessment & Plan Note (Signed)
Likely hypoparathyroidism, he is not very health literate and has no idea of his health history. Years ago he was taking calcitriol and records indicated hyperparathyroidism. Checking calcium and PTH again today and rx for calcium 1 g daily in split dosing.

## 2019-07-29 NOTE — Patient Instructions (Addendum)
We have sent in the calcium to take 1 pill 2 times a day.   We are checking the labs today and have sent in a refill of the cholesterol medicine to take daily.   We have also sent in a refill of the pain medicine to take at night time if needed.   It is okay to stop taking gabapentin since that is not helping.

## 2019-07-29 NOTE — Assessment & Plan Note (Signed)
Did take valtrex after our last visit, will stop gabapentin as not helping. Small amount vicodin for night time only for pain. Advised that pain and numbness could be changes which could take months to resolve or may never resolve fully.

## 2019-07-29 NOTE — Assessment & Plan Note (Signed)
Monitor BP on amlodipine 10 mg daily and BP is improving. Still pain with shingles. Needs visit in 1-2 months for follow up and change meds if needed then.

## 2019-07-30 LAB — PTH, INTACT AND CALCIUM
Calcium: 7 mg/dL — ABNORMAL LOW (ref 8.6–10.3)
PTH: 13 pg/mL — ABNORMAL LOW (ref 14–64)

## 2019-10-05 ENCOUNTER — Other Ambulatory Visit: Payer: Self-pay | Admitting: Internal Medicine

## 2020-07-08 ENCOUNTER — Other Ambulatory Visit: Payer: Self-pay

## 2020-07-08 ENCOUNTER — Encounter: Payer: Self-pay | Admitting: Internal Medicine

## 2020-07-08 ENCOUNTER — Ambulatory Visit: Payer: BLUE CROSS/BLUE SHIELD | Admitting: Internal Medicine

## 2020-07-08 VITALS — BP 202/70 | HR 86 | Temp 98.3°F | Wt 175.0 lb

## 2020-07-08 DIAGNOSIS — I10 Essential (primary) hypertension: Secondary | ICD-10-CM | POA: Diagnosis not present

## 2020-07-08 DIAGNOSIS — R21 Rash and other nonspecific skin eruption: Secondary | ICD-10-CM | POA: Insufficient documentation

## 2020-07-08 MED ORDER — LOSARTAN POTASSIUM 50 MG PO TABS: 50.0000 mg | ORAL_TABLET | Freq: Every day | ORAL | 1 refills | Status: DC

## 2020-07-08 MED ORDER — AMLODIPINE BESYLATE 10 MG PO TABS: 10.0000 mg | ORAL_TABLET | Freq: Every day | ORAL | 1 refills | Status: DC

## 2020-07-08 MED ORDER — LEVOCETIRIZINE DIHYDROCHLORIDE 5 MG PO TABS: 5.0000 mg | ORAL_TABLET | Freq: Every evening | ORAL | 0 refills | Status: DC

## 2020-07-08 MED ORDER — CALCIUM CARBONATE-VITAMIN D 500-200 MG-UNIT PO TABS: 1.0000 | ORAL_TABLET | Freq: Two times a day (BID) | ORAL | 3 refills | Status: DC

## 2020-07-08 MED ORDER — METHYLPREDNISOLONE 4 MG PO TBPK: ORAL_TABLET | ORAL | 0 refills | Status: DC

## 2020-07-08 MED ORDER — FAMOTIDINE 40 MG PO TABS: 40.0000 mg | ORAL_TABLET | Freq: Every day | ORAL | 0 refills | Status: DC

## 2020-07-08 NOTE — Assessment & Plan Note (Signed)
Acute Rash started 2 weeks ago and is throughout his whole body-papular and itchy Benadryl cream without effect No obvious cause Start Medrol Dosepak, Pepcid 40 mg daily and Xyzal 5 mg at night Discussed possible causes CBC, CMP We will follow up in 2 weeks

## 2020-07-08 NOTE — Assessment & Plan Note (Signed)
Chronic He did run out of his calcium and vitamin D, but it sounds like only for 1 week Looks to have hypoparathyroidism We will recheck PTH which was low in the past, CMP and vitamin D

## 2020-07-08 NOTE — Progress Notes (Signed)
Subjective:    Patient ID: Troy Bird, male    DOB: 04-07-1956, 64 y.o.   MRN: [id number]  HPI The patient is here for an acute visit.  He is here alone and a poor historian.  Rash - he has been itching all over his whole body for 2 weeks.  He can not sleep. He has a rash.  He tried benadryl cream and it did not help.   No prior episodes.  He denies any new foods, products or medications.     He ran out of his BP medications about 1 week ago.  He is not taking anything now.    Medications and allergies reviewed with patient and updated if appropriate.  Patient Active Problem List   Diagnosis Date Noted  . Shingles 07/16/2019  . Hypocalcemia 07/16/2019  . Compliance poor 08/28/2017  . 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 Left leg pain 06/20/2017  . Nocturia 04/18/2017  . Routine general medical examination at a health care facility 10/04/2016  . Low back pain 06/22/2016  . Cigarette smoker 11/09/2015  . Gout 11/09/2015  . Essential hypertension 09/01/2015  . Neuropathic pain 09/01/2015  . History of DVT of lower extremity 09/01/2015  . Hyperlipidemia 09/01/2015    Current Outpatient Medications on File Prior to Visit  Medication Sig Dispense Refill  . calcium-vitamin D (OSCAL WITH D) 500-200 MG-UNIT tablet Take 1 tablet by mouth 2 (two) times daily. 180 tablet 3  . gabapentin (NEURONTIN) 300 MG capsule Take 300-600 mg by mouth at bedtime.    Marland Kitchen amLODipine (NORVASC) 10 MG tablet TAKE 1 TABLET BY MOUTH EVERY DAY (Patient not taking: Reported on 07/08/2020) 90 tablet 3  . HYDROcodone-acetaminophen (NORCO/VICODIN) 5-325 MG tablet Take 1 tablet by mouth at bedtime as needed. (Patient not taking: Reported on 07/08/2020) 20 tablet 0  . pentoxifylline (TRENTAL) 400 MG CR tablet Take 1 tablet (400 mg total) by mouth 3 (three) times daily with meals. (Patient not taking: Reported on 07/08/2020) 90 tablet 3  .  pravastatin (PRAVACHOL) 40 MG tablet Take 1 tablet (40 mg total) by mouth daily. (Patient not taking: Reported on 07/08/2020) 90 tablet 3   No current facility-administered medications on file prior to visit.    Past Medical History:  Diagnosis Date  . Alcohol abuse   . Colon polyps   . Hx of blood clots   . Hyperlipidemia   . Hypertension   . Phlebitis     Past Surgical History:  Procedure Laterality Date  . LEG SURGERY Right 2002  . TRACHEAL ESOPHAGEAL PUNCTURE REPAIR  2002    Social History   Socioeconomic History  . Marital status: Married    Spouse name: Not on file  . Number of children: Not on file  . Years of education: Not on file  . Highest education level: Not on file  Occupational History  . Not on file  Tobacco Use  . Smoking status: Former Smoker    Packs/day: 0.25    Years: 57.00    Pack years: 14.25    Types: Cigarettes  . Smokeless tobacco: Never Used  Vaping Use  . Vaping Use: Never used  Substance and Sexual Activity  . Alcohol use: No    Alcohol/week: 0.0 standard drinks  . Drug use: No  . Sexual activity: Not on file  Other Topics Concern  . Not on file  Social History Narrative  . Not on  file   Social Determinants of Health   Financial Resource Strain:   . Difficulty of Paying Living Expenses: Not on file  Food Insecurity:   . Worried About Programme researcher, broadcasting/film/video in the Last Year: Not on file  . Ran Out of Food in the Last Year: Not on file  Transportation Needs:   . Lack of Transportation (Medical): Not on file  . Lack of Transportation (Non-Medical): Not on file  Physical Activity:   . Days of Exercise per Week: Not on file  . Minutes of Exercise per Session: Not on file  Stress:   . Feeling of Stress : Not on file  Social Connections:   . Frequency of Communication with Friends and Family: Not on file  . Frequency of Social Gatherings with Friends and Family: Not on file  . Attends Religious Services: Not on file  . Active  Member of Clubs or Organizations: Not on file  . Attends Banker Meetings: Not on file  . Marital Status: Not on file    Family History  Problem Relation Age of Onset  . Arthritis Mother   . Hypertension Mother   . Alcohol abuse Father   . Hypertension Father   . Diabetes Father   . Heart disease Father     Review of Systems  Constitutional: Negative for chills and fever.  HENT: Negative for sore throat.   Respiratory: Negative for cough, shortness of breath and wheezing.   Cardiovascular: Negative for chest pain, palpitations and leg swelling.  Skin: Positive for rash.  Neurological: Positive for dizziness. Negative for headaches.       Objective:   Vitals:   07/08/20 0921  BP: (!) 202/70  Pulse: 86  Temp: 98.3 F (36.8 C)  SpO2: 99%   BP Readings from Last 3 Encounters:  07/08/20 (!) 202/70  07/29/19 (!) 142/96  07/15/19 (!) 168/112   Wt Readings from Last 3 Encounters:  07/08/20 175 lb (79.4 kg)  07/29/19 171 lb (77.6 kg)  07/15/19 169 lb (76.7 kg)   Body mass index is 24.41 kg/m.   Physical Exam    Constitutional: Appears well-developed and well-nourished. No distress.  Head: Normocephalic and atraumatic.  Neck: Neck supple. No tracheal deviation present. No thyromegaly present.  No cervical lymphadenopathy Cardiovascular: Normal rate, regular rhythm and normal heart sounds.  No murmur heard. No carotid bruit .  No edema Pulmonary/Chest: Effort normal and breath sounds normal. No respiratory distress. No has no wheezes. No rales.  Skin: Skin is warm and dry. Not diaphoretic.  Papular rash can be seen in all areas of his body Psychiatric: Normal mood and affect. Behavior is normal.       Assessment & Plan:    See Problem List for Assessment and Plan of chronic medical problems.    This visit occurred during the SARS-CoV-2 public health emergency.  Safety protocols were in place, including screening questions prior to the visit,  additional usage of staff PPE, and extensive cleaning of exam room while observing appropriate contact time as indicated for disinfecting solutions.

## 2020-07-08 NOTE — Patient Instructions (Addendum)
  Blood work was ordered.     Medications reviewed and updated.  Changes include :     Restart amlodipine for your blood pressure Start losartan 50 mg daily for your blood pressure   Restart your calcium-vitamin D daily  For your rash: Start steroid taper Start pecid daily  Start xyzal   Your prescription(s) have been submitted to your pharmacy. Please take as directed and contact our office if you believe you are having problem(s) with the medication(s).    Please followup in 2 weeks

## 2020-07-08 NOTE — Assessment & Plan Note (Signed)
Chronic Currently uncontrolled and very high Has not been taking his amlodipine for the past week-states he did not have any refills left Stressed the importance of taking his medication daily Restart amlodipine 10 mg daily Given that his blood pressure is so high we will also start losartan 50 mg daily CMP Follow-up in 2 weeks

## 2020-07-09 LAB — CBC WITH DIFFERENTIAL/PLATELET
Absolute Monocytes: 324 cells/uL (ref 200–950)
Basophils Absolute: 29 cells/uL (ref 0–200)
Basophils Relative: 0.4 %
Eosinophils Absolute: 122 cells/uL (ref 15–500)
Eosinophils Relative: 1.7 %
HCT: 42.8 % (ref 38.5–50.0)
Hemoglobin: 14 g/dL (ref 13.2–17.1)
Lymphs Abs: 1728 cells/uL (ref 850–3900)
MCH: 29 pg (ref 27.0–33.0)
MCHC: 32.7 g/dL (ref 32.0–36.0)
MCV: 88.8 fL (ref 80.0–100.0)
MPV: 8.5 fL (ref 7.5–12.5)
Monocytes Relative: 4.5 %
Neutro Abs: 4997 cells/uL (ref 1500–7800)
Neutrophils Relative %: 69.4 %
Platelets: 273 10*3/uL (ref 140–400)
RBC: 4.82 10*6/uL (ref 4.20–5.80)
RDW: 14.9 % (ref 11.0–15.0)
Total Lymphocyte: 24 %
WBC: 7.2 10*3/uL (ref 3.8–10.8)

## 2020-07-09 LAB — COMPLETE METABOLIC PANEL WITH GFR
AG Ratio: 1 (calc) (ref 1.0–2.5)
ALT: 6 U/L — ABNORMAL LOW (ref 9–46)
AST: 18 U/L (ref 10–35)
Albumin: 4.1 g/dL (ref 3.6–5.1)
Alkaline phosphatase (APISO): 117 U/L (ref 35–144)
BUN/Creatinine Ratio: 9 (calc) (ref 6–22)
BUN: 13 mg/dL (ref 7–25)
CO2: 30 mmol/L (ref 20–32)
Calcium: 7 mg/dL — ABNORMAL LOW (ref 8.6–10.3)
Chloride: 101 mmol/L (ref 98–110)
Creat: 1.37 mg/dL — ABNORMAL HIGH (ref 0.70–1.25)
GFR, Est African American: 63 mL/min/{1.73_m2} (ref >=60)
GFR, Est Non African American: 54 mL/min/{1.73_m2} — ABNORMAL LOW (ref >=60)
Globulin: 4.2 g/dL (calc) — ABNORMAL HIGH (ref 1.9–3.7)
Glucose, Bld: 96 mg/dL (ref 65–99)
Potassium: 3.9 mmol/L (ref 3.5–5.3)
Sodium: 140 mmol/L (ref 135–146)
Total Bilirubin: 0.5 mg/dL (ref 0.2–1.2)
Total Protein: 8.3 g/dL — ABNORMAL HIGH (ref 6.1–8.1)

## 2020-07-09 LAB — PTH, INTACT AND CALCIUM
Calcium: 7 mg/dL — ABNORMAL LOW (ref 8.6–10.3)
PTH: 24 pg/mL (ref 14–64)

## 2020-07-09 LAB — VITAMIN D 25 HYDROXY (VIT D DEFICIENCY, FRACTURES): Vit D, 25-Hydroxy: 12 ng/mL — ABNORMAL LOW (ref 30–100)

## 2020-07-11 MED ORDER — VITAMIN D (ERGOCALCIFEROL) 1.25 MG (50000 UNIT) PO CAPS: 50000.0000 [IU] | ORAL_CAPSULE | ORAL | 0 refills | Status: AC

## 2020-07-11 NOTE — Addendum Note (Signed)
Addended by: Pincus Sanes on: 07/11/2020 01:51 PM   Modules accepted: Orders

## 2020-07-15 ENCOUNTER — Other Ambulatory Visit: Payer: Self-pay | Admitting: Internal Medicine

## 2020-07-21 DIAGNOSIS — E559 Vitamin D deficiency, unspecified: Secondary | ICD-10-CM | POA: Insufficient documentation

## 2020-07-21 NOTE — Progress Notes (Signed)
Subjective:    Patient ID: Troy Bird, male    DOB: [birth year], 64 y.o.   MRN: [id number]  HPI The patient is here for follow up of their chronic medical problems, including htn, rash, hypocalcemia/vitamin d def  He is taking all of his medications as prescribed.    He feels cold and ache in his legs.   His rash is not any better.     Medications and allergies reviewed with patient and updated if appropriate.  Patient Active Problem List   Diagnosis Date Noted  . Vitamin D deficiency 07/21/2020  . Rash and nonspecific skin eruption 07/08/2020  . Shingles 07/16/2019  . Hypocalcemia 07/16/2019  . Compliance poor 08/28/2017  . 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 Left leg pain 06/20/2017  . Nocturia 04/18/2017  . Routine general medical examination at a health care facility 10/04/2016  . Low back pain 06/22/2016  . Cigarette smoker 11/09/2015  . Gout 11/09/2015  . Essential hypertension 09/01/2015  . Neuropathic pain 09/01/2015  . History of DVT of lower extremity 09/01/2015  . Hyperlipidemia 09/01/2015    Current Outpatient Medications on File Prior to Visit  Medication Sig Dispense Refill  . amLODipine (NORVASC) 10 MG tablet Take 1 tablet (10 mg total) by mouth daily. 90 tablet 1  . calcium-vitamin D (OSCAL WITH D) 500-200 MG-UNIT tablet Take 1 tablet by mouth 2 (two) times daily. 180 tablet 3  . gabapentin (NEURONTIN) 300 MG capsule Take 300-600 mg by mouth at bedtime.    Marland Kitchen levocetirizine (XYZAL) 5 MG tablet TAKE 1 TABLET (5 MG TOTAL) BY MOUTH EVERY EVENING. FOR YOUR RASH/ITCHING 15 tablet 0  . losartan (COZAAR) 50 MG tablet Take 1 tablet (50 mg total) by mouth daily. 90 tablet 1  . pentoxifylline (TRENTAL) 400 MG CR tablet Take 1 tablet (400 mg total) by mouth 3 (three) times daily with meals. 90 tablet 3  . pravastatin (PRAVACHOL) 40 MG tablet Take 1 tablet (40 mg total) by mouth daily. 90  tablet 3  . Vitamin D, Ergocalciferol, (DRISDOL) 1.25 MG (50000 UNIT) CAPS capsule Take 1 capsule (50,000 Units total) by mouth every 7 (seven) days. 12 capsule 0  . famotidine (PEPCID) 40 MG tablet TAKE 1 TABLET (40 MG TOTAL) BY MOUTH DAILY. FOR YOUR RASH/ITCHING (Patient not taking: Reported on 07/22/2020) 15 tablet 0   No current facility-administered medications on file prior to visit.    Past Medical History:  Diagnosis Date  . Alcohol abuse   . Colon polyps   . Hx of blood clots   . Hyperlipidemia   . Hypertension   . Phlebitis     Past Surgical History:  Procedure Laterality Date  . LEG SURGERY Right 2002  . TRACHEAL ESOPHAGEAL PUNCTURE REPAIR  2002    Social History   Socioeconomic History  . Marital status: Married    Spouse name: Not on file  . Number of children: Not on file  . Years of education: Not on file  . Highest education level: Not on file  Occupational History  . Not on file  Tobacco Use  . Smoking status: Former Smoker    Packs/day: 0.25    Years: 57.00    Pack years: 14.25    Types: Cigarettes  . Smokeless tobacco: Never Used  Vaping Use  . Vaping Use: Never used  Substance and Sexual Activity  . Alcohol use: No    Alcohol/week: 0.0  standard drinks  . Drug use: No  . Sexual activity: Not on file  Other Topics Concern  . Not on file  Social History Narrative  . Not on file   Social Determinants of Health   Financial Resource Strain:   . Difficulty of Paying Living Expenses: Not on file  Food Insecurity:   . Worried About Programme researcher, broadcasting/film/video in the Last Year: Not on file  . Ran Out of Food in the Last Year: Not on file  Transportation Needs:   . Lack of Transportation (Medical): Not on file  . Lack of Transportation (Non-Medical): Not on file  Physical Activity:   . Days of Exercise per Week: Not on file  . Minutes of Exercise per Session: Not on file  Stress:   . Feeling of Stress : Not on file  Social Connections:   .  Frequency of Communication with Friends and Family: Not on file  . Frequency of Social Gatherings with Friends and Family: Not on file  . Attends Religious Services: Not on file  . Active Member of Clubs or Organizations: Not on file  . Attends Banker Meetings: Not on file  . Marital Status: Not on file    Family History  Problem Relation Age of Onset  . Arthritis Mother   . Hypertension Mother   . Alcohol abuse Father   . Hypertension Father   . Diabetes Father   . Heart disease Father     Review of Systems  Constitutional: Negative for fever.  Respiratory: Positive for shortness of breath (with exertion). Negative for cough and wheezing.   Cardiovascular: Negative for chest pain, palpitations and leg swelling.  Gastrointestinal: Negative for abdominal pain and nausea.  Endocrine: Positive for cold intolerance.  Genitourinary: Negative for dysuria and hematuria.  Musculoskeletal:       Bone pain  Neurological: Positive for light-headedness. Negative for headaches (left sided sharp x 2 - lasted < 2 min).       Objective:   Vitals:   07/22/20 0955  BP: (!) 160/90  Pulse: 98  Temp: 98.2 F (36.8 C)  SpO2: 99%   BP Readings from Last 3 Encounters:  07/22/20 (!) 160/90  07/08/20 (!) 202/70  07/29/19 (!) 142/96   Wt Readings from Last 3 Encounters:  07/22/20 176 lb (79.8 kg)  07/08/20 175 lb (79.4 kg)  07/29/19 171 lb (77.6 kg)   Body mass index is 24.55 kg/m.   Physical Exam    Constitutional: Appears well-developed and well-nourished. No distress.  HENT:  Head: Normocephalic and atraumatic.  Neck: Neck supple. No tracheal deviation present. No thyromegaly present.  No cervical lymphadenopathy Cardiovascular: Normal rate, regular rhythm and normal heart sounds.   No murmur heard. No carotid bruit .  No edema Pulmonary/Chest: Effort normal and breath sounds normal. No respiratory distress. No has no wheezes. No rales.  Skin: Skin is warm and  dry. Not diaphoretic. Macular papular rash throughout body Psychiatric: Normal mood and affect. Behavior is normal.      Assessment & Plan:    See Problem List for Assessment and Plan of chronic medical problems.    This visit occurred during the SARS-CoV-2 public health emergency.  Safety protocols were in place, including screening questions prior to the visit, additional usage of staff PPE, and extensive cleaning of exam room while observing appropriate contact time as indicated for disinfecting solutions.

## 2020-07-22 ENCOUNTER — Ambulatory Visit (INDEPENDENT_AMBULATORY_CARE_PROVIDER_SITE_OTHER): Payer: BLUE CROSS/BLUE SHIELD | Admitting: Internal Medicine

## 2020-07-22 ENCOUNTER — Other Ambulatory Visit: Payer: Self-pay

## 2020-07-22 ENCOUNTER — Encounter: Payer: Self-pay | Admitting: Internal Medicine

## 2020-07-22 VITALS — BP 160/90 | HR 98 | Temp 98.2°F | Ht 71.0 in | Wt 176.0 lb

## 2020-07-22 DIAGNOSIS — I10 Essential (primary) hypertension: Secondary | ICD-10-CM

## 2020-07-22 DIAGNOSIS — R21 Rash and other nonspecific skin eruption: Secondary | ICD-10-CM | POA: Diagnosis not present

## 2020-07-22 DIAGNOSIS — E559 Vitamin D deficiency, unspecified: Secondary | ICD-10-CM | POA: Diagnosis not present

## 2020-07-22 LAB — BASIC METABOLIC PANEL
BUN: 13 mg/dL (ref 6–23)
CO2: 29 mEq/L (ref 19–32)
Calcium: 7.2 mg/dL — ABNORMAL LOW (ref 8.4–10.5)
Chloride: 101 mEq/L (ref 96–112)
Creatinine, Ser: 1.29 mg/dL (ref 0.40–1.50)
GFR: 58.17 mL/min — ABNORMAL LOW (ref >60.00)
Glucose, Bld: 104 mg/dL — ABNORMAL HIGH (ref 70–99)
Potassium: 3.7 mEq/L (ref 3.5–5.1)
Sodium: 139 mEq/L (ref 135–145)

## 2020-07-22 MED ORDER — PRAVASTATIN SODIUM 40 MG PO TABS: 40.0000 mg | ORAL_TABLET | Freq: Every day | ORAL | 1 refills | Status: AC

## 2020-07-22 NOTE — Patient Instructions (Addendum)
  Blood work was ordered.     Medications reviewed and updated.  Changes include :   none  Your prescription(s) have been submitted to your pharmacy. Please take as directed and contact our office if you believe you are having problem(s) with the medication(s).  A referral was ordered for Dermatology.     Someone from their office will call you to schedule an appointment.    Please followup in about 1 month

## 2020-07-22 NOTE — Assessment & Plan Note (Signed)
New Last year vitamin d was ok, but it is very low now Calcium and vitamin d daily Started on high dose vitamin d

## 2020-07-22 NOTE — Assessment & Plan Note (Signed)
Chronic Likely related to vitamin d def - on high dose vitamin d Taking calcium and vitamin d daily - stressed taking it regularly

## 2020-07-22 NOTE — Assessment & Plan Note (Signed)
Chronic BP not well controlled Continue amlodipine 10 mg daily and losartan 50 mg daily cmp today -- will adjust medication after GFR checked F/u in about one month

## 2020-07-22 NOTE — Assessment & Plan Note (Addendum)
Started about 4 weeks ago No improvement with medrol dose pak, pepcid 40 mg x 2 weeks and xyzal Will refer to dermatology He did have a bug on him - ? Bed bug, but rash is diffuse

## 2020-07-23 ENCOUNTER — Other Ambulatory Visit: Payer: Self-pay | Admitting: Internal Medicine

## 2020-07-23 MED ORDER — LOSARTAN POTASSIUM 50 MG PO TABS: 100.0000 mg | ORAL_TABLET | Freq: Every day | ORAL | 1 refills | Status: DC

## 2020-07-29 ENCOUNTER — Other Ambulatory Visit: Payer: Self-pay | Admitting: Internal Medicine

## 2020-08-24 ENCOUNTER — Ambulatory Visit: Payer: BLUE CROSS/BLUE SHIELD | Admitting: Internal Medicine

## 2020-08-24 DIAGNOSIS — Z0289 Encounter for other administrative examinations: Secondary | ICD-10-CM

## 2020-10-06 ENCOUNTER — Other Ambulatory Visit: Payer: Self-pay | Admitting: Internal Medicine

## 2020-12-23 ENCOUNTER — Ambulatory Visit: Payer: BLUE CROSS/BLUE SHIELD | Admitting: Dermatology

## 2021-01-22 ENCOUNTER — Other Ambulatory Visit: Payer: Self-pay | Admitting: Internal Medicine

## 2021-01-26 ENCOUNTER — Ambulatory Visit: Payer: BLUE CROSS/BLUE SHIELD | Admitting: Dermatology

## 2021-03-16 NOTE — Progress Notes (Signed)
Subjective:    Patient ID: Troy Bird, male    DOB: May 29, 1956, 65 y.o.   MRN: [id number]  HPI The patient is here for an acute visit.   Back pain - this started two weeks ago.   He had this in the past a while ago.  The pain is located in right lower back and radiates to right lower leg.  He has pain laying down, turning or coughing.  No pain with standing.  He has N/T in the right leg.  He does have some tightness in his back.  He has not taken anything, except topical lidocaine which did not provide improvement in the pain.    He thinks the pain started after picking something up at work.  He has not noticed any other symptoms.   Medications and allergies reviewed with patient and updated if appropriate.  Patient Active Problem List   Diagnosis Date Noted  . Vitamin D deficiency 07/21/2020  . Rash and nonspecific skin eruption 07/08/2020  . Shingles 07/16/2019  . Hypocalcemia 07/16/2019  . Compliance poor 08/28/2017  . 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 Left leg pain 06/20/2017  . Nocturia 04/18/2017  . Routine general medical examination at a health care facility 10/04/2016  . Low back pain 06/22/2016  . Cigarette smoker 11/09/2015  . Gout 11/09/2015  . Essential hypertension 09/01/2015  . Neuropathic pain 09/01/2015  . History of DVT of lower extremity 09/01/2015  . Hyperlipidemia 09/01/2015    Current Outpatient Medications on File Prior to Visit  Medication Sig Dispense Refill  . calcium-vitamin D (OSCAL WITH D) 500-200 MG-UNIT tablet Take 1 tablet by mouth 2 (two) times daily. 180 tablet 3  . famotidine (PEPCID) 40 MG tablet TAKE 1 TABLET (40 MG TOTAL) BY MOUTH DAILY. FOR YOUR RASH/ITCHING 30 tablet 5  . gabapentin (NEURONTIN) 300 MG capsule Take 300-600 mg by mouth at bedtime.    Marland Kitchen levocetirizine (XYZAL) 5 MG tablet Take 1 tablet (5 mg total) by mouth every evening. For your  rash/itching. 30 tablet 5  . losartan (COZAAR) 50 MG tablet Take 2 tablets (100 mg total) by mouth daily. 90 tablet 1  . pentoxifylline (TRENTAL) 400 MG CR tablet Take 1 tablet (400 mg total) by mouth 3 (three) times daily with meals. 90 tablet 3  . pravastatin (PRAVACHOL) 40 MG tablet Take 1 tablet (40 mg total) by mouth daily. 90 tablet 1   No current facility-administered medications on file prior to visit.    Past Medical History:  Diagnosis Date  . Alcohol abuse   . Colon polyps   . Hx of blood clots   . Hyperlipidemia   . Hypertension   . Phlebitis     Past Surgical History:  Procedure Laterality Date  . LEG SURGERY Right 2002  . TRACHEAL ESOPHAGEAL PUNCTURE REPAIR  2002    Social History   Socioeconomic History  . Marital status: Married    Spouse name: Not on file  . Number of children: Not on file  . Years of education: Not on file  . Highest education level: Not on file  Occupational History  . Not on file  Tobacco Use  . Smoking status: Former Smoker    Packs/day: 0.25    Years: 57.00    Pack years: 14.25    Types: Cigarettes  . Smokeless tobacco: Never Used  Vaping Use  . Vaping Use: Never used  Substance  and Sexual Activity  . Alcohol use: No    Alcohol/week: 0.0 standard drinks  . Drug use: No  . Sexual activity: Not on file  Other Topics Concern  . Not on file  Social History Narrative  . Not on file   Social Determinants of Health   Financial Resource Strain: Not on file  Food Insecurity: Not on file  Transportation Needs: Not on file  Physical Activity: Not on file  Stress: Not on file  Social Connections: Not on file    Family History  Problem Relation Age of Onset  . Arthritis Mother   . Hypertension Mother   . Alcohol abuse Father   . Hypertension Father   . Diabetes Father   . Heart disease Father     Review of Systems Per HPI    Objective:   Vitals:   03/17/21 1010 03/17/21 1113  BP: (!) 170/118 (!) 160/120  Pulse:  73   Temp: 98.5 F (36.9 C)   SpO2: 98%    BP Readings from Last 3 Encounters:  03/17/21 (!) 160/120  07/22/20 (!) 160/90  07/08/20 (!) 202/70   Wt Readings from Last 3 Encounters:  03/17/21 169 lb (76.7 kg)  07/22/20 176 lb (79.8 kg)  07/08/20 175 lb (79.4 kg)   Body mass index is 23.57 kg/m.   Physical Exam Constitutional:      General: He is not in acute distress.    Appearance: Normal appearance. He is not ill-appearing.  HENT:     Head: Normocephalic and atraumatic.  Musculoskeletal:        General: Tenderness (Tenderness with palpation of the lower lumbar spine and right paravertebral muscles.  No right SI joint tenderness.  No left lower back tenderness) present.     Right lower leg: Edema (Trace) present.     Left lower leg: Edema (Trace ) present.  Skin:    General: Skin is warm and dry.  Neurological:     Mental Status: He is alert.     Sensory: Sensory deficit present.     Motor: Weakness (Related to pain) present.     Gait: Gait abnormal.     Comments: Positive bilateral straight leg raise right> left            Assessment & Plan:    Right lower back pain with radiculopathy down right leg: Acute-started 2 weeks ago He has had something similar in the past Pain started after lifting something at work Associated with numbness and tingling in the leg and muscle tightness or spasms Will obtain x-ray today Depo-Medrol 80 mg IM x1 He is already on gabapentin-continue 300-600 mg at bedtime Start methocarbamol 500 mg every 8 hours as needed for muscle spasms/tightness Tramadol 50 mg every 8 hours as needed for severe pain Referred for physical therapy Call if no improvement    This visit occurred during the SARS-CoV-2 public health emergency.  Safety protocols were in place, including screening questions prior to the visit, additional usage of staff PPE, and extensive cleaning of exam room while observing appropriate contact time as indicated for  disinfecting solutions.

## 2021-03-17 ENCOUNTER — Ambulatory Visit (INDEPENDENT_AMBULATORY_CARE_PROVIDER_SITE_OTHER): Payer: BLUE CROSS/BLUE SHIELD

## 2021-03-17 ENCOUNTER — Other Ambulatory Visit: Payer: Self-pay

## 2021-03-17 ENCOUNTER — Encounter: Payer: Self-pay | Admitting: Internal Medicine

## 2021-03-17 ENCOUNTER — Ambulatory Visit: Payer: BLUE CROSS/BLUE SHIELD | Admitting: Internal Medicine

## 2021-03-17 VITALS — BP 160/120 | HR 73 | Temp 98.5°F | Ht 71.0 in | Wt 169.0 lb

## 2021-03-17 DIAGNOSIS — M5416 Radiculopathy, lumbar region: Secondary | ICD-10-CM

## 2021-03-17 DIAGNOSIS — M545 Low back pain, unspecified: Secondary | ICD-10-CM | POA: Diagnosis not present

## 2021-03-17 MED ORDER — AMLODIPINE BESYLATE 10 MG PO TABS: 10.0000 mg | ORAL_TABLET | Freq: Every day | ORAL | 0 refills | Status: DC

## 2021-03-17 MED ORDER — TRAMADOL HCL 50 MG PO TABS: 50.0000 mg | ORAL_TABLET | Freq: Three times a day (TID) | ORAL | 0 refills | Status: AC | PRN

## 2021-03-17 MED ORDER — METHYLPREDNISOLONE ACETATE 80 MG/ML IJ SUSP
80.0000 mg | Freq: Once | INTRAMUSCULAR | Status: AC
  Administered 2021-03-17: 80 mg via INTRAMUSCULAR

## 2021-03-17 MED ORDER — METHOCARBAMOL 500 MG PO TABS: 500.0000 mg | ORAL_TABLET | Freq: Three times a day (TID) | ORAL | 0 refills | Status: DC | PRN

## 2021-03-17 NOTE — Patient Instructions (Addendum)
  Have an xray downstairs.     You received a steroid injection today.     Medications changes include :   Methocarbamol 500 mg 3 times a day as needed for muscle spasms.   Tramadol 50 mg three times a day as needed for pain.  Your prescription(s) have been submitted to your pharmacy. Please take as directed and contact our office if you believe you are having problem(s) with the medication(s).   A referral was ordered for physical therapy.     Someone from their office will call you to schedule an appointment.

## 2021-03-23 ENCOUNTER — Other Ambulatory Visit: Payer: Self-pay

## 2021-03-23 ENCOUNTER — Ambulatory Visit: Payer: BLUE CROSS/BLUE SHIELD | Attending: Internal Medicine | Admitting: Physical Therapy

## 2021-03-23 ENCOUNTER — Encounter: Payer: Self-pay | Admitting: Physical Therapy

## 2021-03-23 DIAGNOSIS — R2689 Other abnormalities of gait and mobility: Secondary | ICD-10-CM | POA: Insufficient documentation

## 2021-03-23 DIAGNOSIS — M6281 Muscle weakness (generalized): Secondary | ICD-10-CM | POA: Diagnosis not present

## 2021-03-23 DIAGNOSIS — M545 Low back pain, unspecified: Secondary | ICD-10-CM | POA: Insufficient documentation

## 2021-03-23 NOTE — Therapy (Signed)
[facility] [address] [place], [place], [address] Phone: [phone]   Fax:  [phone]  Physical Therapy Evaluation  Patient Details  Name: Troy Bird MRN: [id number] Date of Birth: 06-18-1956 Referring Provider (PT): Pincus Sanes, MD   Encounter Date: 03/23/2021   PT End of Session - 03/23/21 1706     Visit Number 1    Number of Visits 6    Date for PT Re-Evaluation 05/04/21    Authorization Type BCBS    PT Start Time 1615    PT Stop Time 1645    PT Time Calculation (min) 30 min    Activity Tolerance Patient tolerated treatment well    Behavior During Therapy [facility] for tasks assessed/performed             Past Medical History:  Diagnosis Date   Alcohol abuse    Colon polyps    Hx of blood clots    Hyperlipidemia    Hypertension    Phlebitis     Past Surgical History:  Procedure Laterality Date   LEG SURGERY Right 2002   TRACHEAL ESOPHAGEAL PUNCTURE REPAIR  2002    There were no vitals filed for this visit.    Subjective Assessment - 03/23/21 1616     Subjective Patient reports when he lies down at night and turns to the right and left he has right sided back pain. States this started about 2 weeks when he was lifting up something. States he was given some pain medicine but it hasn't been helping and he hasn't been getting any sleep. He reports that standing up straight can help with the pain. Patient also notes burning pain and severe tenderness to right lower leg.    Pertinent History Per patient previous right femoral IM nail that has limited knee flexion    Limitations Sitting;Other (comment);Lifting;House hold activities;Standing;Walking   Lying down   How long can you sit comfortably? 5 minutes    How long can you stand comfortably? 15-20 minutes    How long can you walk comfortably? 15-20 minutes    Diagnostic tests X-ray    Patient Stated Goals Get pain better    Currently in Pain? Yes     Pain Score 8     Pain Location Back    Pain Orientation Right;Lower    Pain Descriptors / Indicators Sharp;Aching;Tightness    Pain Type Acute pain    Pain Radiating Towards patient reports pain radiating into right lower leg    Pain Onset 1 to 4 weeks ago    Pain Frequency Constant    Aggravating Factors  Lying down, bending forward, twisting side to side    Pain Relieving Factors Standing straight up                [facility] PT Assessment - 03/23/21 0001       Assessment   Medical Diagnosis Lumbar back pain    Referring Provider (PT) Pincus Sanes, MD    Onset Date/Surgical Date --   approximately 2 weeks ago but unclear specific date   Next MD Visit Not scheduled    Prior Therapy None reported      Precautions   Precautions None      Restrictions   Weight Bearing Restrictions No      Balance Screen   Has the patient fallen in the past 6 months No    Has the patient had a decrease in activity level because of  a fear of falling?  No    Is the patient reluctant to leave their home because of a fear of falling?  No      Prior Function   Level of Independence Independent    Leisure None reported      Cognition   Overall Cognitive Status No family/caregiver present to determine baseline cognitive functioning      Observation/Other Assessments   Observations Patient exhibits open wound to right medial distal lower leg that is not currently draining, seems to have odor, lower leg edema noted with significant tenderness that patient reports as concordant to right lower leg burning sensation    Focus on Therapeutic Outcomes (FOTO)  Not assessed due to session time, will reassess next visit      Observation/Other Assessments-Edema    Edema --   Not formally assessed, obvious right lower leg edema noted compared to left lower leg     Sensation   Light Touch Impaired by gross assessment    Additional Comments Patient reports altered sensation right lower leg       Coordination   Gross Motor Movements are Fluid and Coordinated Yes      Posture/Postural Control   Posture Comments Patient exhibits decreased lumbar lordosis, rounded shoulder and forward head posture      ROM / Strength   AROM / PROM / Strength AROM      AROM   AROM Assessment Site Lumbar;Knee    Right/Left Knee Right    Right Knee Extension 0    Right Knee Flexion 20    Lumbar Flexion 50% - patient reports pulling sensation right lower back, no radicular symptoms    Lumbar Extension <25% - patient reports improvement in right lower back symptoms with repeated motions      Flexibility   Soft Tissue Assessment /Muscle Length yes    Hamstrings Limited right > left      Palpation   Spinal mobility Not assessed    SI assessment  Not assessed    Palpation comment TTP to right lower leg      Ambulation/Gait   Ambulation/Gait Yes    Gait Comments Patient exhibits RLE remains in extension throughout gait with circumduction in swing                        Objective measurements completed on examination: See above findings.       [facility] Adult PT Treatment/Exercise - 03/23/21 0001       Exercises   Exercises Lumbar      Lumbar Exercises: Stretches   Passive Hamstring Stretch 30 seconds    Passive Hamstring Stretch Limitations seated edge of mat    Other Lumbar Stretch Exercise Reviewed repeated extension exercises consisting of extension on elbows, prone press up, standing extension at counter                    PT Education - 03/23/21 1646     Education Details Exam findings, POC, HEP, following up with PCP due to open wound on right medial distal lower leg, found 2 bed bugs on patient during visit so informed him of the presense of bugs and instructed him to follow-up with pest control or other mechanism to get rid of bed bugs    Person(s) Educated Patient    Methods Explanation;Demonstration;Verbal cues;Handout;Tactile cues    Comprehension  Verbalized understanding;Returned demonstration;Verbal cues required;Tactile cues required;Need further instruction  PT Short Term Goals - 03/23/21 1720       PT SHORT TERM GOAL #1   Title Patient will be I with initial HEP to reduce pain and progress with PT    Time 3    Period Weeks    Status New    Target Date 04/13/21      PT SHORT TERM GOAL #2   Title Patient will report improved pain to 6/10 with lying down to improve sleep    Time 3    Period Weeks    Status New    Target Date 04/13/21      PT SHORT TERM GOAL #3   Title Assess and review FOTO at 2nd visit    Time 2    Period Weeks    Status New    Target Date 04/06/21               PT Long Term Goals - 03/23/21 1724       PT LONG TERM GOAL #1   Title Patient will be I with final HEP to maintain progress from PT    Time 6    Period Weeks    Status New    Target Date 05/04/21      PT LONG TERM GOAL #2   Title Patient will be able to perform household tasks with </= 3/10 pain to reduce functional limitation    Time 6    Period Weeks    Status New    Target Date 05/04/21      PT LONG TERM GOAL #3   Title Patient will be able to walk >/= 30 minutes to improve community access    Time 6    Period Weeks    Status New    Target Date 05/04/21      PT LONG TERM GOAL #4   Title Establish FOTO goal once assessed                    Plan - 03/23/21 1707     Clinical Impression Statement Patient presents to PT with report of acute onset right sided low back pain and burning pain right lower leg. Evaluation limited due to diffiuclty with patient's subjective as he was an unclear historian, finding two bed bugs on the patient and due to the presense of reoccuring wound to medial distal lower leg which patient was highly encouraged to follow-up with his PCP. Based on the limited examination, patient does exhibit limitation in lumbar motion in all planes but reported an extension  preference especially in prone so exercises provided to patient to initiate repeated extensions at home. Patient did not report any true radicular symptoms to explain right leg burning pain. He did exhibit an open wound that was not presently draining, but seemed to have an odor and increased swelling and concordant tenderness to the lower leg. Patient was highly encouraged to call his PCP at conclusion of therapy to follow-up regarding reoccurence of wound and possible infection. Patient was also informed of the presense of bed bugs and this is reason why concluding therapy early, he was encouraged to follow-up with pest control as able. Patient would benefit from continued skilled PT to reduce pain, improve, mobility, and maximize functional ability to perform lifting and other household tasks without limitation.    Personal Factors and Comorbidities Fitness;Past/Current Experience;Comorbidity 1    Comorbidities Limited right knee flexion due to previous femoral IM nail per patient  Examination-Activity Limitations Locomotion Level;Sit;Sleep;Lift;Bend;Dressing;Stand    Examination-Participation Restrictions Cleaning;Community Activity;Driving;Shop;Laundry;Yard Work;Occupation    Stability/Clinical Decision Making Evolving/Moderate complexity    Clinical Decision Making Moderate    Rehab Potential Fair    PT Frequency 1x / week    PT Duration 6 weeks    PT Treatment/Interventions ADLs/Self Care Home Management;Cryotherapy;Electrical Stimulation;Moist Heat;Traction;Neuromuscular re-education;Therapeutic exercise;Therapeutic activities;Functional mobility training;Stair training;Gait training;Patient/family education;Manual techniques;Dry needling;Passive range of motion;Taping;Spinal Manipulations;Joint Manipulations    PT Next Visit Plan Assess whether patient followed-up with PCP regarding right lower leg wound, review HEP and progress PRN, continue extension based exercises if beneficial, manual for  lumbar mobility, initiate core/hip strengthening as able    PT Home Exercise Plan [facility]    Consulted and Agree with Plan of Care Patient             Patient will benefit from skilled therapeutic intervention in order to improve the following deficits and impairments:  Abnormal gait, Decreased range of motion, Decreased activity tolerance, Pain, Impaired flexibility, Postural dysfunction, Decreased strength, Impaired sensation, Increased edema  Visit Diagnosis: Acute right-sided low back pain, unspecified whether sciatica present  Other abnormalities of gait and mobility  Muscle weakness (generalized)     Problem List Patient Active Problem List   Diagnosis Date Noted   Vitamin D deficiency 07/21/2020   Rash and nonspecific skin eruption 07/08/2020   Shingles 07/16/2019   Hypocalcemia 07/16/2019   Compliance poor 08/28/2017   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 (HCC) 07/31/2017   Left leg pain 06/20/2017   Nocturia 04/18/2017   Routine general medical examination at a health care facility 10/04/2016   Low back pain 06/22/2016   Cigarette smoker 11/09/2015   Gout 11/09/2015   Essential hypertension 09/01/2015   Neuropathic pain 09/01/2015   History of DVT of lower extremity 09/01/2015   Hyperlipidemia 09/01/2015    Rosana Hoes, PT, DPT, LAT, ATC 03/23/21  5:38 PM Phone: [phone] Fax: [phone]   [facility] [address] [place], [place], [address] Phone: [phone]   Fax:  [phone]  Name: Troy Bird MRN: [id number] Date of Birth: 02/22/1956

## 2021-03-23 NOTE — Patient Instructions (Signed)
Access Code: [facility] URL: [email or web address] Date: 03/23/2021 Prepared by: Rosana Hoes  Exercises Prone Press Up On Elbows - 2-3 x daily - 7 x weekly - 10 reps Prone Press Up - 2-3 x daily - 7 x weekly - 10 reps Standing Lumbar Extension with Counter - 2-3 x daily - 7 x weekly - 10 reps Seated Hamstring Stretch - 2-3 x daily - 7 x weekly - 3 reps - 20 hold

## 2021-03-30 ENCOUNTER — Ambulatory Visit: Payer: BLUE CROSS/BLUE SHIELD

## 2021-03-30 ENCOUNTER — Telehealth: Payer: Self-pay

## 2021-03-30 NOTE — Telephone Encounter (Signed)
PT left message for pt detailing that this is their first no-show. The attendance policy was discussed at length and the pt was instructed about their next scheduled appointment.

## 2021-04-08 ENCOUNTER — Other Ambulatory Visit: Payer: Self-pay

## 2021-04-08 ENCOUNTER — Encounter: Payer: Self-pay | Admitting: Physical Therapy

## 2021-04-08 ENCOUNTER — Ambulatory Visit: Payer: BLUE CROSS/BLUE SHIELD | Admitting: Physical Therapy

## 2021-04-08 DIAGNOSIS — R2689 Other abnormalities of gait and mobility: Secondary | ICD-10-CM

## 2021-04-08 DIAGNOSIS — M6281 Muscle weakness (generalized): Secondary | ICD-10-CM

## 2021-04-08 DIAGNOSIS — M545 Low back pain, unspecified: Secondary | ICD-10-CM

## 2021-04-08 NOTE — Therapy (Addendum)
[place], [place], [address] Phone: [phone]   Fax:  [phone]  Physical Therapy Treatment / No Charge Visit / Discharge  Patient Details  Name: Troy Bird MRN: [id number] Date of Birth: Mar 18, 1956 Referring Provider (PT): Binnie Rail, MD   Encounter Date: 04/08/2021   PT End of Session - 04/08/21 1557     Visit Number 2   no charge for this visit   Number of Visits 6    Date for PT Re-Evaluation 05/04/21    Authorization Type [employer]    PT Start Time 1600    PT Stop Time 1620    PT Time Calculation (min) 20 min    Activity Tolerance --    Behavior During Therapy --             Past Medical History:  Diagnosis Date   Alcohol abuse    Colon polyps    Hx of blood clots    Hyperlipidemia    Hypertension    Phlebitis     Past Surgical History:  Procedure Laterality Date   LEG SURGERY Right 2002   TRACHEAL ESOPHAGEAL PUNCTURE REPAIR  2002    There were no vitals filed for this visit.   Subjective Assessment - 04/08/21 1623     Subjective Patient arrives reports his lower back feels alright but he continues to have pain in right lower leg where his wound is. States that the wound has been draining but he can't tell what color it is. He has not followed up with his PCP or referring provider.    Currently in Pain? Yes    Pain Score 8     Pain Location Leg    Pain Orientation Lower    Pain Descriptors / Indicators Throbbing;Burning                [facility] PT Assessment - 04/08/21 0001       Observation/Other Assessments   Observations Patient exhibits wound to right distal medial lower leg that has a yellowish discoloration and pus-like drainage, and a foul odor; the right lower leg seems to have increased swelling                                   PT Education - 04/08/21 1557     Education Details Patient educated that PT will be placed on hold  until he is able to follow-up with his doctor regarding wound on right lower leg, patient provided contact information for his PCP and referring provider to call and schedule an appointment and emphasized importance of this    Person(s) Educated Patient    Methods Explanation;Handout    Comprehension Verbalized understanding              PT Short Term Goals - 03/23/21 1720       PT SHORT TERM GOAL #1   Title Patient will be I with initial HEP to reduce pain and progress with PT    Time 3    Period Weeks    Status New    Target Date 04/13/21      PT SHORT TERM GOAL #2   Title Patient will report improved pain to 6/10 with lying down to improve sleep    Time 3    Period Weeks    Status New    Target Date 04/13/21  PT SHORT TERM GOAL #3   Title Assess and review FOTO at 2nd visit    Time 2    Period Weeks    Status New    Target Date 04/06/21               PT Long Term Goals - 03/23/21 1724       PT LONG TERM GOAL #1   Title Patient will be I with final HEP to maintain progress from PT    Time 6    Period Weeks    Status New    Target Date 05/04/21      PT LONG TERM GOAL #2   Title Patient will be able to perform household tasks with </= 3/10 pain to reduce functional limitation    Time 6    Period Weeks    Status New    Target Date 05/04/21      PT LONG TERM GOAL #3   Title Patient will be able to walk >/= 30 minutes to improve community access    Time 6    Period Weeks    Status New    Target Date 05/04/21      PT LONG TERM GOAL #4   Title Establish FOTO goal once assessed                   Plan - 04/08/21 1557     Clinical Impression Statement Patient arrives to therapy reporting continued severe pain in right lower leg around the region of his wound. Patient's wound looks worse than last time he was in the clinic, now exhibiting an observable larger wound bed, yellowish discoloration and pus like discharge, with a foul odor.  Patient states the pain he feels is directly corresponding to his wound and is unlikely related to the back. Patient was provided the contact information for his PCP and referring provider and instructed to call them immediately or stop by their office to schedule an appointment. PT will be placed on hold and future appointments canceled until patient is able to follow-up with his doctor to address wound concerns, as his pain is not likely PT related.    PT Treatment/Interventions ADLs/Self Care Home Management;Cryotherapy;Electrical Stimulation;Moist Heat;Traction;Neuromuscular re-education;Therapeutic exercise;Therapeutic activities;Functional mobility training;Stair training;Gait training;Patient/family education;Manual techniques;Dry needling;Passive range of motion;Taping;Spinal Manipulations;Joint Manipulations    PT Next Visit Plan PT placed on hold until patient able to follow-up with PCP regarding wound to right lower leg    PT Home Exercise Plan [facility]    Consulted and Agree with Plan of Care Patient             Patient will benefit from skilled therapeutic intervention in order to improve the following deficits and impairments:  Abnormal gait, Decreased range of motion, Decreased activity tolerance, Pain, Impaired flexibility, Postural dysfunction, Decreased strength, Impaired sensation, Increased edema  Visit Diagnosis: Acute right-sided low back pain, unspecified whether sciatica present  Other abnormalities of gait and mobility  Muscle weakness (generalized)     Problem List Patient Active Problem List   Diagnosis Date Noted   Vitamin D deficiency 07/21/2020   Rash and nonspecific skin eruption 07/08/2020   Shingles 07/16/2019   Hypocalcemia 07/16/2019   Compliance poor 08/28/2017   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 [address] [place], [place], [address] Phone: [phone]   Fax:  [phone]  Name: Troy Bird MRN: [id number] Date of Birth: [birth year]   PHYSICAL THERAPY DISCHARGE SUMMARY  Visits from Start of Care: 2  Current functional level related to goals / functional outcomes: See above   Remaining deficits: See above   Education / Equipment: HEP   Patient agrees to discharge. Patient goals were not met. Patient is being discharged due to not returning since the last visit.

## 2021-04-09 ENCOUNTER — Emergency Department (HOSPITAL_COMMUNITY): Payer: BLUE CROSS/BLUE SHIELD

## 2021-04-09 ENCOUNTER — Encounter (HOSPITAL_COMMUNITY): Payer: Self-pay | Admitting: Emergency Medicine

## 2021-04-09 ENCOUNTER — Emergency Department (HOSPITAL_COMMUNITY)
Admission: EM | Admit: 2021-04-09 | Discharge: 2021-04-10 | Disposition: A | Discharge status: Home / Self Care | Payer: BLUE CROSS/BLUE SHIELD | Attending: Emergency Medicine | Admitting: Emergency Medicine

## 2021-04-09 DIAGNOSIS — Z87891 Personal history of nicotine dependence: Secondary | ICD-10-CM | POA: Insufficient documentation

## 2021-04-09 DIAGNOSIS — Z79899 Other long term (current) drug therapy: Secondary | ICD-10-CM | POA: Insufficient documentation

## 2021-04-09 DIAGNOSIS — R2241 Localized swelling, mass and lump, right lower limb: Secondary | ICD-10-CM | POA: Diagnosis not present

## 2021-04-09 DIAGNOSIS — Z86718 Personal history of other venous thrombosis and embolism: Secondary | ICD-10-CM | POA: Diagnosis not present

## 2021-04-09 DIAGNOSIS — I10 Essential (primary) hypertension: Secondary | ICD-10-CM | POA: Diagnosis not present

## 2021-04-09 DIAGNOSIS — I872 Venous insufficiency (chronic) (peripheral): Secondary | ICD-10-CM | POA: Diagnosis not present

## 2021-04-09 DIAGNOSIS — L03115 Cellulitis of right lower limb: Secondary | ICD-10-CM

## 2021-04-09 DIAGNOSIS — I878 Other specified disorders of veins: Secondary | ICD-10-CM

## 2021-04-09 DIAGNOSIS — S91001A Unspecified open wound, right ankle, initial encounter: Secondary | ICD-10-CM | POA: Diagnosis not present

## 2021-04-09 LAB — CBC WITH DIFFERENTIAL/PLATELET
Abs Immature Granulocytes: 0.02 10*3/uL (ref 0.00–0.07)
Basophils Absolute: 0 10*3/uL (ref 0.0–0.1)
Basophils Relative: 1 %
Eosinophils Absolute: 0 10*3/uL (ref 0.0–0.5)
Eosinophils Relative: 0 %
HCT: 37.5 % — ABNORMAL LOW (ref 39.0–52.0)
Hemoglobin: 12.3 g/dL — ABNORMAL LOW (ref 13.0–17.0)
Immature Granulocytes: 0 %
Lymphocytes Relative: 20 %
Lymphs Abs: 1.6 10*3/uL (ref 0.7–4.0)
MCH: 28.8 pg (ref 26.0–34.0)
MCHC: 32.8 g/dL (ref 30.0–36.0)
MCV: 87.8 fL (ref 80.0–100.0)
Monocytes Absolute: 0.4 10*3/uL (ref 0.1–1.0)
Monocytes Relative: 5 %
Neutro Abs: 5.7 10*3/uL (ref 1.7–7.7)
Neutrophils Relative %: 74 %
Platelets: 249 10*3/uL (ref 150–400)
RBC: 4.27 MIL/uL (ref 4.22–5.81)
RDW: 14.5 % (ref 11.5–15.5)
WBC: 7.7 10*3/uL (ref 4.0–10.5)
nRBC: 0 % (ref 0.0–0.2)

## 2021-04-09 LAB — COMPREHENSIVE METABOLIC PANEL
ALT: 10 U/L (ref 0–44)
AST: 21 U/L (ref 15–41)
Albumin: 3.5 g/dL (ref 3.5–5.0)
Alkaline Phosphatase: 89 U/L (ref 38–126)
Anion gap: 8 (ref 5–15)
BUN: 16 mg/dL (ref 8–23)
CO2: 26 mmol/L (ref 22–32)
Calcium: 6.7 mg/dL — ABNORMAL LOW (ref 8.9–10.3)
Chloride: 104 mmol/L (ref 98–111)
Creatinine, Ser: 1.53 mg/dL — ABNORMAL HIGH (ref 0.61–1.24)
GFR, Estimated: 50 mL/min — ABNORMAL LOW (ref >60)
Glucose, Bld: 101 mg/dL — ABNORMAL HIGH (ref 70–99)
Potassium: 3.8 mmol/L (ref 3.5–5.1)
Sodium: 138 mmol/L (ref 135–145)
Total Bilirubin: 0.8 mg/dL (ref 0.3–1.2)
Total Protein: 7.7 g/dL (ref 6.5–8.1)

## 2021-04-09 LAB — LACTIC ACID, PLASMA: Lactic Acid, Venous: 1.7 mmol/L (ref 0.5–1.9)

## 2021-04-09 NOTE — ED Triage Notes (Signed)
Pt requesting to have his right leg check for a wound infection,  pt states he is having the wound for a while but is getting infected and worse.

## 2021-04-09 NOTE — ED Provider Notes (Signed)
Emergency Medicine Provider Triage Evaluation Note  Troy Bird , a 65 y.o. male  was evaluated in triage.  Pt complains of worsening wound to the right leg.  Pt has a history of chronic venous ulcers. Has been to the wound clinic in the past. About 1 month ago started developing a new ulcer to the right leg just proximal to the ankle. Worsening the past week with drainage and foul odor. Could not be evaluated by his PCP so he came to the ED. No fevers, n/v.   Review of Systems  Positive:  Negative:   Physical Exam  BP (!) 170/98   Pulse 91   Temp 98.4 F (36.9 C) (Oral)   Resp 18   Ht 5\' 11"  (1.803 m)   Wt 80.7 kg   SpO2 100%   BMI 24.83 kg/m  Gen:   Awake, no distress   Resp:  Normal effort  MSK:   Moves extremities without difficulty; large ulcer just proximal to the medial malleolus with erythema, tenderness and swelling throughout the right ankle and foot. Other:    Medical Decision Making  Medically screening exam initiated at 1:36 PM.  Appropriate orders placed.  Troy Bird was informed that the remainder of the evaluation will be completed by another provider, this initial triage assessment does not replace that evaluation, and the importance of remaining in the ED until their evaluation is complete.   Troy Persons, PA-C 04/09/21 1338    06/10/21, DO 04/12/21 1857

## 2021-04-10 ENCOUNTER — Ambulatory Visit (HOSPITAL_COMMUNITY): Admission: RE | Admit: 2021-04-10 | Payer: BLUE CROSS/BLUE SHIELD | Source: Ambulatory Visit

## 2021-04-10 MED ORDER — OXYCODONE-ACETAMINOPHEN 5-325 MG PO TABS: 1.0000 | ORAL_TABLET | Freq: Four times a day (QID) | ORAL | 0 refills | Status: DC | PRN

## 2021-04-10 MED ORDER — OXYCODONE-ACETAMINOPHEN 5-325 MG PO TABS
2.0000 | ORAL_TABLET | Freq: Once | ORAL | Status: AC
  Administered 2021-04-10: 2 via ORAL
  Filled 2021-04-10: qty 2

## 2021-04-10 MED ORDER — DOXYCYCLINE HYCLATE 100 MG PO TABS
100.0000 mg | ORAL_TABLET | Freq: Once | ORAL | Status: AC
  Administered 2021-04-10: 100 mg via ORAL
  Filled 2021-04-10: qty 1

## 2021-04-10 MED ORDER — DOXYCYCLINE HYCLATE 100 MG PO CAPS: 100.0000 mg | ORAL_CAPSULE | Freq: Two times a day (BID) | ORAL | 0 refills | Status: DC

## 2021-04-10 NOTE — ED Notes (Addendum)
Pt called multiple times.

## 2021-04-10 NOTE — ED Notes (Signed)
Patient verbalizes understanding of discharge instructions. Opportunity for questioning and answers were provided. Armband removed by staff, pt discharged from ED ambulatory.   

## 2021-04-10 NOTE — ED Notes (Signed)
Pt verified that he had ride prior to pain meds given

## 2021-04-10 NOTE — ED Provider Notes (Signed)
[facility] EMERGENCY DEPARTMENT Provider Note   CSN: [id number] Arrival date & time: 04/09/21  1247     History Chief Complaint  Patient presents with   Wound Infection    Troy Bird is a 65 y.o. male.  Patient with chronic venous stasis and some pain/drainage from wound on right leg. Also right leg is swollen. Has had this bgefore and been to wound care for same. States it seemed to heal pretty well but over last couple weeks symptoms are worsening again. Hasn't seen anyone else for symptoms.        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 ([facility]) 07/31/2017   Left leg pain 06/20/2017   Nocturia 04/18/2017   Routine general medical examination at a health care facility 10/04/2016   Low back pain 06/22/2016   Cigarette smoker 11/09/2015   Gout 11/09/2015   Essential hypertension 09/01/2015   Neuropathic pain 09/01/2015   History of DVT of lower extremity 09/01/2015   Hyperlipidemia 09/01/2015    Past Surgical History:  Procedure Laterality Date   LEG SURGERY Right 2002   TRACHEAL ESOPHAGEAL PUNCTURE REPAIR  2002       Family History  Problem Relation Age of Onset   Arthritis Mother    Hypertension Mother    Alcohol abuse Father    Hypertension Father    Diabetes Father    Heart disease Father     Social History   Tobacco Use   Smoking status: Former    Packs/day: 0.25    Years: 57.00    Pack years: 14.25    Types: Cigarettes   Smokeless tobacco: Never  Vaping Use   Vaping Use: Never used  Substance Use Topics   Alcohol  use: No    Alcohol/week: 0.0 standard drinks   Drug use: No    Home Medications Prior to Admission medications   Medication Sig Start Date End Date Taking? Authorizing Provider  doxycycline (VIBRAMYCIN) 100 MG capsule Take 1 capsule (100 mg total) by mouth 2 (two) times daily. One po bid x 7 days 04/10/21  Yes Marlaysia Lenig, Barbara Cower, MD  oxyCODONE-acetaminophen (PERCOCET) 5-325 MG tablet Take 1 tablet by mouth every 6 (six) hours as needed. 04/10/21  Yes Chrisa Hassan, Barbara Cower, MD  amLODipine (NORVASC) 10 MG tablet Take 1 tablet (10 mg total) by mouth daily. 03/17/21   Pincus Sanes, MD  calcium-vitamin D (OSCAL WITH D) 500-200 MG-UNIT tablet Take 1 tablet by mouth 2 (two) times daily. 07/08/20   Pincus Sanes, MD  famotidine (PEPCID) 40 MG tablet TAKE 1 TABLET (40 MG TOTAL) BY MOUTH DAILY. FOR YOUR RASH/ITCHING 07/29/20   Pincus Sanes, MD  gabapentin (NEURONTIN) 300 MG capsule Take 300-600 mg by mouth at bedtime.    [provider]  levocetirizine (XYZAL) 5 MG tablet Take 1 tablet (5 mg total) by mouth every evening. For your rash/itching. 07/29/20   Pincus Sanes, MD  losartan (COZAAR) 50 MG tablet Take 2 tablets (100 mg total) by mouth daily. 07/23/20  Pincus Sanes, MD  methocarbamol (ROBAXIN) 500 MG tablet Take 1 tablet (500 mg total) by mouth every 8 (eight) hours as needed for muscle spasms. 03/17/21   Pincus Sanes, MD  pentoxifylline (TRENTAL) 400 MG CR tablet Take 1 tablet (400 mg total) by mouth 3 (three) times daily with meals. 07/31/17   Nadara Mustard, MD  pravastatin (PRAVACHOL) 40 MG tablet Take 1 tablet (40 mg total) by mouth daily. 07/22/20   Pincus Sanes, MD    Allergies    Lyrica [pregabalin]  Review of Systems   Review of Systems  All other systems reviewed and are negative.  Physical Exam Updated Vital Signs BP (!) 147/98   Pulse 71   Temp (!) 97.3 F (36.3 C) (Oral) Comment: Pt drank water  Resp 20   Ht 5\' 11"  (1.803 m)   Wt 80.7 kg   SpO2 98%   BMI 24.83 kg/m    Physical Exam Vitals and nursing note reviewed.  Constitutional:      Appearance: He is well-developed.  HENT:     Head: Normocephalic and atraumatic.     Mouth/Throat:     Mouth: Mucous membranes are moist.     Pharynx: Oropharynx is clear.  Eyes:     Pupils: Pupils are equal, round, and reactive to light.  Cardiovascular:     Rate and Rhythm: Normal rate.  Pulmonary:     Effort: Pulmonary effort is normal. No respiratory distress.  Abdominal:     General: Abdomen is flat. There is no distension.  Musculoskeletal:        General: Swelling and tenderness (RLE) present. Normal range of motion.     Cervical back: Normal range of motion.  Skin:    General: Skin is warm and dry.     Comments: Right leg with approximately 6x6 cm area of open wound and superifical infection  Neurological:     General: No focal deficit present.     Mental Status: He is alert.    ED Results / Procedures / Treatments   Labs (all labs ordered are listed, but only abnormal results are displayed) Labs Reviewed  COMPREHENSIVE METABOLIC PANEL - Abnormal; Notable for the following components:      Result Value   Glucose, Bld 101 (*)    Creatinine, Ser 1.53 (*)    Calcium 6.7 (*)    GFR, Estimated 50 (*)    All other components within normal limits  CBC WITH DIFFERENTIAL/PLATELET - Abnormal; Notable for the following components:   Hemoglobin 12.3 (*)    HCT 37.5 (*)    All other components within normal limits  CULTURE, BLOOD (ROUTINE X 2)  CULTURE, BLOOD (ROUTINE X 2)  LACTIC ACID, PLASMA  LACTIC ACID, PLASMA    EKG None  Radiology DG Ankle Complete Right  Result Date: 04/09/2021 CLINICAL DATA:  Medial malleolar wound. EXAM: RIGHT ANKLE - COMPLETE 3+ VIEW COMPARISON:  None. FINDINGS: There is no evidence of fracture, dislocation, or joint effusion. There is no evidence of arthropathy or other focal bone abnormality. Soft tissues are unremarkable. IMPRESSION: Negative. Electronically Signed    By: 06/10/2021 M.D.   On: 04/09/2021 14:26    Procedures Procedures   Medications Ordered in ED Medications  oxyCODONE-acetaminophen (PERCOCET/ROXICET) 5-325 MG per tablet 2 tablet (2 tablets Oral Given 04/10/21 0221)  doxycycline (VIBRA-TABS) tablet 100 mg (100 mg Oral Given 04/10/21 0222)    ED Course  I have reviewed the triage vital  signs and the nursing notes.  Pertinent labs & imaging results that were available during my care of the patient were reviewed by me and considered in my medical decision making (see chart for details).    MDM Rules/Calculators/A&P                          Suspect he has a superficial infection associated with his venous stasis ulcer.  Will refer back to wound care.  Antibiotics as well.  His whole leg is swollen he states that this is new and not normal for him so we will have him get an outpatient ultrasound as well.  Final Clinical Impression(s) / ED Diagnoses Final diagnoses:  Cellulitis of right lower extremity  Venous stasis    Rx / DC Orders ED Discharge Orders          Ordered    AMB referral to wound care center        04/10/21 0145    LE VENOUS        04/10/21 0145    oxyCODONE-acetaminophen (PERCOCET) 5-325 MG tablet  Every 6 hours PRN        04/10/21 0212    doxycycline (VIBRAMYCIN) 100 MG capsule  2 times daily        04/10/21 8185             Karrigan Messamore, Barbara Cower, MD 04/10/21 [phone]

## 2021-04-11 ENCOUNTER — Ambulatory Visit (HOSPITAL_COMMUNITY)
Admission: RE | Admit: 2021-04-11 | Discharge: 2021-04-11 | Disposition: A | Discharge status: Home / Self Care | Payer: BLUE CROSS/BLUE SHIELD | Source: Ambulatory Visit | Attending: Emergency Medicine | Admitting: Emergency Medicine

## 2021-04-11 DIAGNOSIS — M7989 Other specified soft tissue disorders: Secondary | ICD-10-CM | POA: Diagnosis not present

## 2021-04-11 DIAGNOSIS — L0390 Cellulitis, unspecified: Secondary | ICD-10-CM

## 2021-04-11 DIAGNOSIS — I872 Venous insufficiency (chronic) (peripheral): Secondary | ICD-10-CM | POA: Diagnosis not present

## 2021-04-11 DIAGNOSIS — I878 Other specified disorders of veins: Secondary | ICD-10-CM | POA: Diagnosis not present

## 2021-04-11 NOTE — Progress Notes (Signed)
VASCULAR LAB    Right lower extremity venous duplex has been performed.  See CV proc for preliminary results.   Broderic Bara, RVT 04/11/2021, 11:57 AM

## 2021-04-13 ENCOUNTER — Telehealth: Payer: Self-pay | Admitting: Internal Medicine

## 2021-04-13 NOTE — Telephone Encounter (Signed)
[facility] FYI 7.2.22:  Troy Bird states he was seen in the ER last night. He states he has a leg infection. He states he needs an appt to see the wound center.  Advised to see PCP within 24 hours- left a VM for pt

## 2021-04-13 NOTE — Telephone Encounter (Signed)
It looks like he has upcoming apt with wound center already for next week. I have not seen since 2020 and would need visit to place referral.

## 2021-04-13 NOTE — Telephone Encounter (Signed)
Fyi.

## 2021-04-14 LAB — CULTURE, BLOOD (ROUTINE X 2)
Culture: NO GROWTH
Culture: NO GROWTH
Special Requests: ADEQUATE
Special Requests: ADEQUATE

## 2021-04-15 ENCOUNTER — Encounter: Payer: BLUE CROSS/BLUE SHIELD | Admitting: Physical Therapy

## 2021-04-20 ENCOUNTER — Encounter (HOSPITAL_BASED_OUTPATIENT_CLINIC_OR_DEPARTMENT_OTHER): Payer: BLUE CROSS/BLUE SHIELD | Attending: Internal Medicine | Admitting: Internal Medicine

## 2021-04-20 ENCOUNTER — Other Ambulatory Visit: Payer: Self-pay

## 2021-04-20 DIAGNOSIS — L97819 Non-pressure chronic ulcer of other part of right lower leg with unspecified severity: Secondary | ICD-10-CM | POA: Diagnosis not present

## 2021-04-20 DIAGNOSIS — I872 Venous insufficiency (chronic) (peripheral): Secondary | ICD-10-CM | POA: Diagnosis not present

## 2021-04-20 DIAGNOSIS — I10 Essential (primary) hypertension: Secondary | ICD-10-CM | POA: Diagnosis not present

## 2021-04-20 DIAGNOSIS — F17210 Nicotine dependence, cigarettes, uncomplicated: Secondary | ICD-10-CM | POA: Insufficient documentation

## 2021-04-20 NOTE — Progress Notes (Signed)
TYREAK, REAGLE ([id number]) Visit Report for 04/20/2021 Abuse/Suicide Risk Screen Details Patient Name: Date of Service: [place], [name] NA LD 04/20/2021 7:30 A M Medical Record Number: [id number] Patient Account Number: [id number] Date of Birth/Sex: Treating RN: 10/20/1955 (65 y.o. [name] Primary Care Sarh Kirschenbaum: Hillard Danker Other Clinician: Referring Cadel Stairs: Treating Taner Rzepka/Extender: Leandrew Koyanagi in Treatment: 0 Abuse/Suicide Risk Screen Items Answer ABUSE RISK SCREEN: Has anyone close to you tried to hurt or harm you recentlyo No Do you feel uncomfortable with anyone in your familyo No Has anyone forced you do things that you didnt want to doo No Electronic Signature(s) Signed: 04/20/2021 5:43:47 PM By: Zandra Abts RN, BSN Entered By: Zandra Abts on 04/20/2021 07:45:13 -------------------------------------------------------------------------------- Activities of Daily Living Details Patient Name: Date of Service: [name], [name] NA LD 04/20/2021 7:30 A M Medical Record Number: [id number] Patient Account Number: [id number] Date of Birth/Sex: Treating RN: [birth year] (65 y.o. [name] Primary Care Jolon Degante: Hillard Danker Other Clinician: Referring Kamil Mchaffie: Treating Javonne Dorko/Extender: Leandrew Koyanagi in Treatment: 0 Activities of Daily Living Items Answer Activities of Daily Living (Please select one for each item) Drive Automobile Completely Able T Medications ake Completely Able Use T elephone Completely Able Care for Appearance Completely Able Use T oilet Completely Able Bath / Shower Completely Able Dress Self Completely Able Feed Self Completely Able Walk Completely Able Get In / Out Bed Completely Able Housework Completely Able Prepare Meals Completely Able Handle Money Completely Able Shop for Self Completely Able Electronic Signature(s) Signed: 04/20/2021 5:43:47 PM By:  Zandra Abts RN, BSN Entered By: Zandra Abts on 04/20/2021 07:46:10 -------------------------------------------------------------------------------- Education Screening Details Patient Name: Date of Service: [name], [name] 04/20/2021 7:30 A M Medical Record Number: [id number] Patient Account Number: [id number] Date of Birth/Sex: Treating RN: 02-25-1956 (65 y.o. [name] Primary Care Jayah Balthazar: Hillard Danker Other Clinician: Referring Robie Oats: Treating Ola Fawver/Extender: Leandrew Koyanagi in Treatment: 0 Primary Learner Assessed: Patient Learning Preferences/Education Level/Primary Language Learning Preference: Explanation, Demonstration, Printed Material Highest Education Level: High School Preferred Language: English Cognitive Barrier Language Barrier: No Translator Needed: No Memory Deficit: No Emotional Barrier: No Cultural/Religious Beliefs Affecting Medical Care: No Physical Barrier Impaired Vision: No Impaired Hearing: No Decreased Hand dexterity: No Knowledge/Comprehension Knowledge Level: High Comprehension Level: High Ability to understand written instructions: High Ability to understand verbal instructions: High Motivation Anxiety Level: Calm Cooperation: Cooperative Education Importance: Acknowledges Need Interest in Health Problems: Asks Questions Perception: Coherent Willingness to Engage in Self-Management High Activities: Readiness to Engage in Self-Management High Activities: Electronic Signature(s) Signed: 04/20/2021 5:43:47 PM By: Zandra Abts RN, BSN Entered By: Zandra Abts on 04/20/2021 07:47:05 -------------------------------------------------------------------------------- Fall Risk Assessment Details Patient Name: Date of Service: SA NDERS, [name] 04/20/2021 7:30 A M Medical Record Number: [id number] Patient Account Number: [id number] Date of Birth/Sex: Treating RN: 06/10/1956 (65 y.o. [name] Primary Care Rona Tomson: Hillard Danker Other Clinician: Referring Akim Watkinson: Treating Teliyah Royal/Extender: Leandrew Koyanagi in Treatment: 0 Fall Risk Assessment Items Have you had 2 or more falls in the last 12 monthso 0 No Have you had any fall that resulted in injury in the last 12 monthso 0 No FALLS RISK SCREEN History of falling - immediate or within 3 months 0 No Secondary diagnosis (Do you have 2 or more medical diagnoseso) 15 Yes Ambulatory aid None/bed rest/wheelchair/nurse 0 Yes Crutches/cane/walker 0 No Furniture 0 No Intravenous therapy Access/Saline/Heparin Lock 0 No Gait/Transferring Normal/ bed rest/  wheelchair 0 Yes Weak (short steps with or without shuffle, stooped but able to lift head while walking, may seek 0 No support from furniture) Impaired (short steps with shuffle, may have difficulty arising from chair, head down, impaired 0 No balance) Mental Status Oriented to own ability 0 Yes Electronic Signature(s) Signed: 04/20/2021 5:43:47 PM By: Zandra Abts RN, BSN Entered By: Zandra Abts on 04/20/2021 07:47:26 -------------------------------------------------------------------------------- Foot Assessment Details Patient Name: Date of Service: [name], [name] 04/20/2021 7:30 A M Medical Record Number: [id number] Patient Account Number: [id number] Date of Birth/Sex: Treating RN: 08-25-1956 (65 y.o. [name] Primary Care Jessenya Berdan: Hillard Danker Other Clinician: Referring Jazir Newey: Treating Shina Wass/Extender: Leandrew Koyanagi in Treatment: 0 Foot Assessment Items Site Locations + = Sensation present, - = Sensation absent, C = Callus, U = Ulcer R = Redness, W = Warmth, M = Maceration, PU = Pre-ulcerative lesion F = Fissure, S = Swelling, D = Dryness Assessment Right: Left: Other Deformity: No No Prior Foot Ulcer: No No Prior Amputation: No No Charcot Joint: No  No Ambulatory Status: Ambulatory With Help Assistance Device: Cane Gait: Steady Electronic Signature(s) Signed: 04/20/2021 5:43:47 PM By: Zandra Abts RN, BSN Entered By: Zandra Abts on 04/20/2021 07:49:12 -------------------------------------------------------------------------------- Nutrition Risk Screening Details Patient Name: Date of Service: SA NDERS, [name] 04/20/2021 7:30 A M Medical Record Number: [id number] Patient Account Number: [id number] Date of Birth/Sex: Treating RN: July 17, 1956 (65 y.o. [name] Primary Care Aleesha Ringstad: Hillard Danker Other Clinician: Referring Isebella Upshur: Treating Jareli Highland/Extender: Leandrew Koyanagi in Treatment: 0 Height (in): 71 Weight (lbs): 178 Body Mass Index (BMI): 24.8 Nutrition Risk Screening Items Score Screening NUTRITION RISK SCREEN: I have an illness or condition that made me change the kind and/or amount of food I eat 0 No I eat fewer than two meals per day 0 No I eat few fruits and vegetables, or milk products 0 No I have three or more drinks of beer, liquor or wine almost every day 0 No I have tooth or mouth problems that make it hard for me to eat 0 No I don't always have enough money to buy the food I need 0 No I eat alone most of the time 0 No I take three or more different prescribed or over-the-counter drugs a day 1 Yes Without wanting to, I have lost or gained 10 pounds in the last six months 0 No I am not always physically able to shop, cook and/or feed myself 0 No Nutrition Protocols Good Risk Protocol Moderate Risk Protocol 0 Provide education on nutrition High Risk Proctocol Risk Level: Good Risk Score: 1 Electronic Signature(s) Signed: 04/20/2021 5:43:47 PM By: Zandra Abts RN, BSN Entered By: Zandra Abts on 04/20/2021 07:47:35

## 2021-04-20 NOTE — Progress Notes (Signed)
IRAN, ROWE ([id number]) Visit Report for 04/20/2021 Chief Complaint Document Details Patient Name: Date of Service: [place], [name] NA LD 04/20/2021 7:30 A M Medical Record Number: [id number] Patient Account Number: [id number] Date of Birth/Sex: Treating RN: Aug 14, 1956 (65 y.o. Troy Bird Primary Care Provider: Hillard Bird Other Clinician: Referring Provider: Treating Provider/Extender: Troy Bird in Treatment: 0 Information Obtained from: Patient Chief Complaint right lower extremity wound Electronic Signature(s) Signed: 04/20/2021 10:26:19 AM By: Troy Corwin DO Entered By: Troy Bird on 04/20/2021 08:47:18 -------------------------------------------------------------------------------- HPI Details Patient Name: Date of Service: [name], [name] 04/20/2021 7:30 A M Medical Record Number: [id number] Patient Account Number: [id number] Date of Birth/Sex: Treating RN: [birth year] (65 y.o. Troy Bird Primary Care Provider: Hillard Bird Other Clinician: Referring Provider: Treating Provider/Extender: Troy Bird in Treatment: 0 History of Present Illness HPI Description: Admission 7/12 [name] is a 65 year old male with a past medical history of chronic venous insufficiency and hypertension that presents to the clinic for a right lower extremity wound. He has experienced a wound to this location about 3 years ago and was seen in our clinic for treatment. His current wound started 2 months ago. He has compression stockings and states he has been wearing them. He has been on his feet more often due to his job working at [employer]. He has been using Neosporin to the wound. He visited the ED for this issue and was given doxycycline for possible cellulitis. He also reports having a DVT study done that did not show a clot. He currently denies signs of infection. Electronic  Signature(s) Signed: 04/20/2021 10:26:19 AM By: Troy Corwin DO Entered By: Troy Bird on 04/20/2021 08:52:42 -------------------------------------------------------------------------------- Physical Exam Details Patient Name: Date of Service: SA NDERS, [name] 04/20/2021 7:30 A M Medical Record Number: [id number] Patient Account Number: [id number] Date of Birth/Sex: Treating RN: Dec 09, 1955 (65 y.o. Troy Bird Primary Care Provider: Hillard Bird Other Clinician: Referring Provider: Treating Provider/Extender: Troy Bird in Treatment: 0 Constitutional respirations regular, non-labored and within target range for patient.Marland Kitchen Psychiatric pleasant and cooperative. Notes Right lower extremity: T the lower medial aspect there is an open wound with granulation tissue and fibrotic tissue present. There is also serous drainage that is o dried in the wound bed. 3+ pitting edema to the knee. Right leg with more swelling than the left leg Electronic Signature(s) Signed: 04/20/2021 10:26:19 AM By: Troy Corwin DO Entered By: Troy Bird on 04/20/2021 10:12:16 -------------------------------------------------------------------------------- Physician Orders Details Patient Name: Date of Service: SA NDERS, [name] 04/20/2021 7:30 A M Medical Record Number: [id number] Patient Account Number: [id number] Date of Birth/Sex: Treating RN: [birth year] (65 y.o. Troy Bird Primary Care Provider: Hillard Bird Other Clinician: Referring Provider: Treating Provider/Extender: Troy Bird in Treatment: 0 Verbal / Phone Orders: No Diagnosis Coding ICD-10 Coding Code Description L97.819 Non-pressure chronic ulcer of other part of right lower leg with unspecified severity I87.2 Venous insufficiency (chronic) (peripheral) I10 Essential (primary) hypertension Follow-up Appointments ppointment in 1 week. -  with Dr. Mikey Bird Return A Bathing/ Shower/ Hygiene May shower with protection but do not get wound dressing(s) wet. Edema Control - Lymphedema / SCD / Other Elevate legs to the level of the heart or above for 30 minutes daily and/or when sitting, a frequency of: - throughout the day Avoid standing for long periods of time. Exercise regularly Wound Treatment Wound #4 - Lower Leg Wound Laterality: Right, Medial  Cleanser: Soap and Water 1 x Per Week Discharge Instructions: May shower and wash wound with dial antibacterial soap and water prior to dressing change. Cleanser: Wound Cleanser 1 x Per Week Discharge Instructions: Cleanse the wound with wound cleanser prior to applying a clean dressing using gauze sponges, not tissue or cotton balls. Peri-Wound Care: Sween Lotion (Moisturizing lotion) 1 x Per Week Discharge Instructions: Apply moisturizing lotion as directed Prim Dressing: KerraCel Ag Gelling Fiber Dressing, 4x5 in (silver alginate) 1 x Per Week ary Discharge Instructions: Apply silver alginate to wound bed as instructed Secondary Dressing: Woven Gauze Sponge, Non-Sterile 4x4 in 1 x Per Week Discharge Instructions: Apply over primary dressing as directed. Secondary Dressing: ABD Pad, 8x10 1 x Per Week Discharge Instructions: Apply over primary dressing as directed. Compression Wrap: ThreePress (3 layer compression wrap) 1 x Per Week Discharge Instructions: Apply three layer compression as directed. Electronic Signature(s) Signed: 04/20/2021 10:26:19 AM By: Troy CorwinHoffman, Troy Wechter DO Entered By: Troy CorwinHoffman, Meyah Corle on 04/20/2021 10:12:32 -------------------------------------------------------------------------------- Problem List Details Patient Name: Date of Service: SA Pecolia AdesNDERS, [name] 04/20/2021 7:30 A M Medical Record Number: [id number] Patient Account Number: [id number] Date of Birth/Sex: Treating RN: 1956/03/02 (65 y.o. Troy SoursM) Bird, Troy Primary Care Provider: Hillard Dankerrawford,  Troy Other Clinician: Referring Provider: Treating Provider/Extender: Troy KoyanagiHoffman, Troy Bird Bird, Troy Bird in Treatment: 0 Active Problems ICD-10 Encounter Code Description Active Date MDM Diagnosis L97.819 Non-pressure chronic ulcer of other part of right lower leg with unspecified 04/20/2021 No Yes severity I87.2 Venous insufficiency (chronic) (peripheral) 04/20/2021 No Yes I10 Essential (primary) hypertension 04/20/2021 No Yes Inactive Problems Resolved Problems Electronic Signature(s) Signed: 04/20/2021 10:26:19 AM By: Troy CorwinHoffman, Emera Bussie DO Entered By: Troy CorwinHoffman, Corby Villasenor on 04/20/2021 08:46:52 -------------------------------------------------------------------------------- Progress Note Details Patient Name: Date of Service: SA NDERS, [name] 04/20/2021 7:30 A M Medical Record Number: [id number] Patient Account Number: [id number] Date of Birth/Sex: Treating RN: 1956/03/02 (65 y.o. Troy KollerM) Lynch, [place] Primary Care Provider: Hillard Dankerrawford, Troy Other Clinician: Referring Provider: Treating Provider/Extender: Troy KoyanagiHoffman, Jeannine Pennisi Bird, Troy Bird in Treatment: 0 Subjective Chief Complaint Information obtained from Patient right lower extremity wound History of Present Illness (HPI) Admission 7/12 [name] is a 65 year old male with a past medical history of chronic venous insufficiency and hypertension that presents to the clinic for a right lower extremity wound. He has experienced a wound to this location about 3 years ago and was seen in our clinic for treatment. His current wound started 2 months ago. He has compression stockings and states he has been wearing them. He has been on his feet more often due to his job working at [employer]. He has been using Neosporin to the wound. He visited the ED for this issue and was given doxycycline for possible cellulitis. He also reports having a DVT study done that did not show a clot. He currently denies signs of  infection. Patient History Information obtained from Patient. Allergies Lyrica (Reaction: swelling) Family History Cancer - Siblings, Diabetes - Father, Heart Disease - Father, Hypertension - Father, Thyroid Problems - Father, No family history of Hereditary Spherocytosis, Kidney Disease, Lung Disease, Seizures, Stroke, Tuberculosis. Social History Current every day smoker - half pack per day, Marital Status - Married, Alcohol Use - Rarely, Drug Use - No History, Caffeine Use - Daily - coffee. Medical History Eyes Denies history of Cataracts, Glaucoma, Optic Neuritis Ear/Nose/Mouth/Throat Denies history of Chronic sinus problems/congestion, Middle ear problems Hematologic/Lymphatic Denies history of Anemia, Hemophilia, Lymphedema, Sickle Cell Disease Respiratory Denies history of Aspiration, Asthma, Chronic Obstructive Pulmonary  Disease (COPD), Pneumothorax, Sleep Apnea, Tuberculosis Cardiovascular Patient has history of Deep Vein Thrombosis - right, Hypertension, Peripheral Venous Disease Denies history of Angina, Arrhythmia, Congestive Heart Failure, Coronary Artery Disease, Hypotension, Myocardial Infarction, Peripheral Arterial Disease, Phlebitis, Vasculitis Gastrointestinal Denies history of Cirrhosis , Colitis, Crohnoos, Hepatitis A, Hepatitis B, Hepatitis C Endocrine Denies history of Type I Diabetes, Type II Diabetes Genitourinary Denies history of End Stage Renal Disease Immunological Denies history of Lupus Erythematosus, Raynaudoos, Scleroderma Integumentary (Skin) Denies history of History of Burn Musculoskeletal Patient has history of Gout Denies history of Rheumatoid Arthritis, Osteoarthritis, Osteomyelitis Neurologic Denies history of Dementia, Neuropathy, Quadriplegia, Paraplegia, Seizure Disorder Oncologic Denies history of Received Chemotherapy, Received Radiation Psychiatric Denies history of Anorexia/bulimia, Confinement Anxiety Hospitalization/Surgery  History - [place], [place]. Review of Systems (ROS) Constitutional Symptoms (General Health) Denies complaints or symptoms of Fatigue, Fever, Chills, Marked Weight Change. Eyes Complains or has symptoms of Glasses / Contacts. Ear/Nose/Mouth/Throat Denies complaints or symptoms of Chronic sinus problems or rhinitis. Respiratory Denies complaints or symptoms of Chronic or frequent coughs, Shortness of Breath. Gastrointestinal Denies complaints or symptoms of Frequent diarrhea, Nausea, Vomiting. Endocrine Denies complaints or symptoms of Heat/cold intolerance. Genitourinary Denies complaints or symptoms of Frequent urination. Integumentary (Skin) Complains or has symptoms of Wounds - wound on right lower leg. Neurologic Denies complaints or symptoms of Numbness/parasthesias. Psychiatric Denies complaints or symptoms of Claustrophobia, Suicidal. Objective Constitutional respirations regular, non-labored and within target range for patient.. Vitals Time Taken: 7:45 AM, Height: 71 in, Source: Stated, Weight: 178 lbs, Source: Stated, BMI: 24.8, Temperature: 97.9 F, Pulse: 80 bpm, Respiratory Rate: 18 breaths/min, Blood Pressure: 205/118 mmHg. Psychiatric pleasant and cooperative. General Notes: Right lower extremity: T the lower medial aspect there is an open wound with granulation tissue and fibrotic tissue present. There is also serous o drainage that is dried in the wound bed. 3+ pitting edema to the knee. Right leg with more swelling than the left leg Integumentary (Hair, Skin) Wound #4 status is Open. Original cause of wound was Gradually Appeared. The date acquired was: 02/07/2021. The wound is located on the Right,Medial Lower Leg. The wound measures 4.7cm length x 1.5cm width x 0.1cm depth; 5.537cm^2 area and 0.554cm^3 volume. There is Fat Layer (Subcutaneous Tissue) exposed. There is no tunneling or undermining noted. There is a medium amount of  serosanguineous drainage noted. The wound margin is flat and intact. There is medium (34-66%) pink, pale granulation within the wound bed. There is a medium (34-66%) amount of necrotic tissue within the wound bed including Eschar and Adherent Slough. Assessment Active Problems ICD-10 Non-pressure chronic ulcer of other part of right lower leg with unspecified severity Venous insufficiency (chronic) (peripheral) Essential (primary) hypertension Patient presents with a 53-month history of nonhealing ulcer to his right lower extremity. He visited the ED due to worsening symptoms. He had a DVT study on 04/11/2021 that showed no evidence of DVT to the right lower extremity. He was prescribed doxycycline for possible cellulitis and has finished this course. His ABIs today were 1.1 on the right. I do not see any obvious signs of infection on exam. I do not think he needs more antibiotics. I recommended using silver alginate with 3 layer compression. Due to increased tenderness on exam I was not able to debride. He had a similar presentation 3 years ago in our clinic. He was treated with compression therapy and silver collagen. 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 Because he has recurrence of his ulcer I will refer him back to see if he qualifies for an ablation. He also wanted a note to be out of work for the next month. He is on his feet more often due to his job at [employer]. I recommended he keep his legs elevated throughout the day. We gave him a note to be out until August 9. 45 minutes was spent during this encounter including face-to-face and EMR review Procedures Wound #4 Pre-procedure diagnosis of Wound #4 is a Venous Leg Ulcer located on the Right,Medial Lower Leg . There was a Three Layer Compression Therapy Procedure by Zandra Abts, RN. Post procedure Diagnosis Wound #4: Same as  Pre-Procedure Plan Follow-up Appointments: Return Appointment in 1 week. - with Dr. Mikey Bird Bathing/ Shower/ Hygiene: May shower with protection but do not get wound dressing(s) wet. Edema Control - Lymphedema / SCD / Other: Elevate legs to the level of the heart or above for 30 minutes daily and/or when sitting, a frequency of: - throughout the day Avoid standing for long periods of time. Exercise regularly WOUND #4: - Lower Leg Wound Laterality: Right, Medial Cleanser: Soap and Water 1 x Per Week/ Discharge Instructions: May shower and wash wound with dial antibacterial soap and water prior to dressing change. Cleanser: Wound Cleanser 1 x Per Week/ Discharge Instructions: Cleanse the wound with wound cleanser prior to applying a clean dressing using gauze sponges, not tissue or cotton balls. Peri-Wound Care: Sween Lotion (Moisturizing lotion) 1 x Per Week/ Discharge Instructions: Apply moisturizing lotion as directed Prim Dressing: KerraCel Ag Gelling Fiber Dressing, 4x5 in (silver alginate) 1 x Per Week/ ary Discharge Instructions: Apply silver alginate to wound bed as instructed Secondary Dressing: Woven Gauze Sponge, Non-Sterile 4x4 in 1 x Per Week/ Discharge Instructions: Apply over primary dressing as directed. Secondary Dressing: ABD Pad, 8x10 1 x Per Week/ Discharge Instructions: Apply over primary dressing as directed. Com pression Wrap: ThreePress (3 layer compression wrap) 1 x Per Week/ Discharge Instructions: Apply three layer compression as directed. 1. Silver alginate with 3 layer compression 2. Follow-up with vein and vascular 3. Follow-up in 1 week 4. Work note to be out until August 9th Psychologist, prison and probation services) Signed: 04/20/2021 10:26:19 AM By: Troy Corwin DO Entered By: Troy Bird on 04/20/2021 10:25:20 -------------------------------------------------------------------------------- HxROS Details Patient Name: Date of Service: SA NDERS, [name]  04/20/2021 7:30 A M Medical Record Number: [id number] Patient Account Number: [id number] Date of Birth/Sex: Treating RN: 1956-02-13 (65 y.o. Troy Bird Primary Care Provider: Hillard Bird Other Clinician: Referring Provider: Treating Provider/Extender: Troy Bird in Treatment: 0 Information Obtained From Patient Constitutional Symptoms (General Health) Complaints and Symptoms: Negative for: Fatigue; Fever; Chills; Marked Weight Change Eyes Complaints and Symptoms: Positive for: Glasses / Contacts Medical History: Negative for: Cataracts; Glaucoma; Optic Neuritis Ear/Nose/Mouth/Throat Complaints and Symptoms: Negative for: Chronic sinus problems or rhinitis Medical History: Negative for: Chronic sinus problems/congestion; Middle ear problems Respiratory Complaints and Symptoms: Negative for: Chronic or frequent coughs; Shortness of Breath Medical History: Negative for: Aspiration; Asthma; Chronic Obstructive Pulmonary Disease (COPD); Pneumothorax; Sleep Apnea; Tuberculosis Gastrointestinal Complaints and Symptoms: Negative for: Frequent diarrhea; Nausea; Vomiting Medical History: Negative for: Cirrhosis ; Colitis; Crohns; Hepatitis A; Hepatitis B; Hepatitis C Endocrine Complaints and Symptoms: Negative for: Heat/cold intolerance Medical History: Negative for: Type I Diabetes; Type II Diabetes Genitourinary Complaints and Symptoms: Negative for: Frequent urination  Medical History: Negative for: End Stage Renal Disease Integumentary (Skin) Complaints and Symptoms: Positive for: Wounds - wound on right lower leg Medical History: Negative for: History of Burn Neurologic Complaints and Symptoms: Negative for: Numbness/parasthesias Medical History: Negative for: Dementia; Neuropathy; Quadriplegia; Paraplegia; Seizure Disorder Psychiatric Complaints and Symptoms: Negative for: Claustrophobia; Suicidal Medical  History: Negative for: Anorexia/bulimia; Confinement Anxiety Hematologic/Lymphatic Medical History: Negative for: Anemia; Hemophilia; Lymphedema; Sickle Cell Disease Cardiovascular Medical History: Positive for: Deep Vein Thrombosis - right; Hypertension; Peripheral Venous Disease Negative for: Angina; Arrhythmia; Congestive Heart Failure; Coronary Artery Disease; Hypotension; Myocardial Infarction; Peripheral Arterial Disease; Phlebitis; Vasculitis Immunological Medical History: Negative for: Lupus Erythematosus; Raynauds; Scleroderma Musculoskeletal Medical History: Positive for: Gout Negative for: Rheumatoid Arthritis; Osteoarthritis; Osteomyelitis Oncologic Medical History: Negative for: Received Chemotherapy; Received Radiation Immunizations Pneumococcal Vaccine: Received Pneumococcal Vaccination: No Implantable Devices No devices added Hospitalization / Surgery History Type of Hospitalization/Surgery [place], [place] Family and Social History Cancer: Yes - Siblings; Diabetes: Yes - Father; Heart Disease: Yes - Father; Hereditary Spherocytosis: No; Hypertension: Yes - Father; Kidney Disease: No; Lung Disease: No; Seizures: No; Stroke: No; Thyroid Problems: Yes - Father; Tuberculosis: No; Current every day smoker - half pack per day; Marital Status - Married; Alcohol Use: Rarely; Drug Use: No History; Caffeine Use: Daily - coffee; Financial Concerns: No; Food, Clothing or Shelter Needs: No; Transportation Concerns: No Electronic Signature(s) Signed: 04/20/2021 10:26:19 AM By: Troy Corwin DO Signed: 04/20/2021 5:43:47 PM By: Zandra Abts RN, BSN Entered By: Zandra Abts on 04/20/2021 07:45:06 -------------------------------------------------------------------------------- SuperBill Details Patient Name: Date of Service: [name], [name] 04/20/2021 Medical Record Number: [id number] Patient Account Number: [id number] Date of Birth/Sex: Treating  RN: 1956/08/04 (65 y.o. Troy Bird Primary Care Provider: Hillard Bird Other Clinician: Referring Provider: Treating Provider/Extender: Troy Bird in Treatment: 0 Diagnosis Coding ICD-10 Codes Code Description [phone] Non-pressure chronic ulcer of other part of right lower leg with unspecified severity I87.2 Venous insufficiency (chronic) (peripheral) I10 Essential (primary) hypertension Facility Procedures CPT4 Code: [id number] Description: 99213 - WOUND CARE VISIT-LEV 3 EST PT Modifier: 25 Quantity: 1 CPT4 Code: [id number] Description: (Facility Use Only) [id number] - APPLY MULTLAY COMPRS LWR RT LEG Modifier: Quantity: 1 Physician Procedures : CPT4 Code Description Modifier [id number] - WC PHYS LEVEL 4 - EST PT ICD-10 Diagnosis Description L97.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

## 2021-04-22 ENCOUNTER — Encounter: Payer: BLUE CROSS/BLUE SHIELD | Admitting: Physical Therapy

## 2021-04-26 NOTE — Progress Notes (Signed)
DICKIE, CLOE ([id number]) Visit Report for 04/20/2021 Allergy List Details Patient Name: Date of Service: [place], [name] NA LD 04/20/2021 7:30 A M Medical Record Number: [id number] Patient Account Number: [id number] Date of Birth/Sex: Treating RN: January 08, 1956 (65 y.o. [name] Primary Care Nasra Counce: Hillard Danker Other Clinician: Referring Oden Lindaman: Treating Kyriakos Babler/Extender: Leandrew Koyanagi in Treatment: 0 Allergies Active Allergies Lyrica Reaction: swelling Allergy Notes Electronic Signature(s) Signed: 04/20/2021 5:43:47 PM By: Zandra Abts RN, BSN Entered By: Zandra Abts on 04/20/2021 07:45:49 -------------------------------------------------------------------------------- Arrival Information Details Patient Name: Date of Service: [name], [name] 04/20/2021 7:30 A M Medical Record Number: [id number] Patient Account Number: [id number] Date of Birth/Sex: Treating RN: 1956-01-03 (65 y.o. [name] Primary Care Paige Monarrez: Hillard Danker Other Clinician: Referring Burleigh Brockmann: Treating Aaryav Hopfensperger/Extender: Leandrew Koyanagi in Treatment: 0 Visit Information Patient Arrived: Danella Maiers Time: 07:40 Accompanied By: alone Transfer Assistance: None Patient Identification Verified: Yes Secondary Verification Process Completed: Yes Patient Requires Transmission-Based Precautions: No Patient Has Alerts: No History Since Last Visit Added or deleted any medications: No Any new allergies or adverse reactions: No Had a fall or experienced change in activities of daily living that may affect risk of falls: No Signs or symptoms of abuse/neglect since last visito No Hospitalized since last visit: No Implantable device outside of the clinic excluding cellular tissue based products placed in the center since last visit: No Electronic Signature(s) Signed: 04/20/2021 5:43:47 PM By: Zandra Abts RN, BSN Entered  By: Zandra Abts on 04/20/2021 07:41:51 -------------------------------------------------------------------------------- Clinic Level of Care Assessment Details Patient Name: Date of Service: [facility], [name] 04/20/2021 7:30 A M Medical Record Number: [id number] Patient Account Number: [id number] Date of Birth/Sex: Treating RN: 1956/07/13 (65 y.o. [name] Primary Care Alianys Chacko: Hillard Danker Other Clinician: Referring Sederick Jacobsen: Treating Sonna Lipsky/Extender: Leandrew Koyanagi in Treatment: 0 Clinic Level of Care Assessment Items TOOL 1 Quantity Score X- 1 0 Use when EandM and Procedure is performed on INITIAL visit ASSESSMENTS - Nursing Assessment / Reassessment X- 1 20 General Physical Exam (combine w/ comprehensive assessment (listed just below) when performed on new pt. evals) X- 1 25 Comprehensive Assessment (HX, ROS, Risk Assessments, Wounds Hx, etc.) ASSESSMENTS - Wound and Skin Assessment / Reassessment []  - 0 Dermatologic / Skin Assessment (not related to wound area) ASSESSMENTS - Ostomy and/or Continence Assessment and Care []  - 0 Incontinence Assessment and Management []  - 0 Ostomy Care Assessment and Management (repouching, etc.) PROCESS - Coordination of Care X - Simple Patient / Family Education for ongoing care 1 15 []  - 0 Complex (extensive) Patient / Family Education for ongoing care X- 1 10 Staff obtains , Records, T Results / Process Orders est []  - 0 Staff telephones HHA, Nursing Homes / Clarify orders / etc []  - 0 Routine Transfer to another Facility (non-emergent condition) []  - 0 Routine Hospital Admission (non-emergent condition) X- 1 15 New Admissions / / Ordering NPWT Apligraf, etc. , []  - 0 Emergency Hospital Admission (emergent condition) PROCESS - Special Needs []  - 0 Pediatric / Minor Patient Management []  - 0 Isolation Patient Management []  - 0 Hearing / Language  / Visual special needs []  - 0 Assessment of Community assistance (transportation, D/C planning, etc.) []  - 0 Additional assistance / Altered mentation []  - 0 Support Surface(s) Assessment (bed, cushion, seat, etc.) INTERVENTIONS - Miscellaneous []  - 0 External ear exam []  - 0 Patient Transfer (multiple staff / /  Similar devices) []  - 0 Simple Staple / Suture removal (25 or less) []  - 0 Complex Staple / Suture removal (26 or more) []  - 0 Hypo/Hyperglycemic Management (do not check if billed separately) X- 1 15 Ankle / Brachial Index (ABI) - do not check if billed separately Has the patient been seen at the hospital within the last three years: Yes Total Score: 100 Level Of Care: New/Established - Level 3 Electronic Signature(s) Signed: 04/20/2021 5:43:47 PM By: RN, BSN Signed: 04/20/2021 5:43:47 PM By: 06/21/2021 RN, BSN Entered By: Zandra Abts on 04/20/2021 10:41:34 -------------------------------------------------------------------------------- Compression Therapy Details Patient Name: Date of Service: [place] Zandra Abts, [name] 04/20/2021 7:30 A M Medical Record Number: 06/21/2021 Patient Account Number: Pecolia Ades Date of Birth/Sex: Treating RN: [birth year] (65 y.o. [id number] Primary Care Pebble Botkin: 06/25/1956 Other Clinician: Referring Crockett Rallo: Treating Jagar Lua/Extender: 77 in Treatment: 0 Compression Therapy Performed for Wound Assessment: Wound #4 Right,Medial Lower Leg Performed By: Clinician [name], RN Compression Type: Three Layer Post Procedure Diagnosis Same as Pre-procedure Electronic Signature(s) Signed: 04/20/2021 5:43:47 PM By: Leandrew Koyanagi RN, BSN Entered By: Zandra Abts on 04/20/2021 08:42:10 -------------------------------------------------------------------------------- Encounter Discharge Information Details Patient Name: Date of Service: SA Zandra Abts, RO NA  LD 04/20/2021 7:30 A M Medical Record Number: 06/21/2021 Patient Account Number: Pecolia Ades Date of Birth/Sex: Treating RN: August 20, 1956 (65 y.o. [id number] Primary Care Marilena Trevathan: 06/25/1956 Other Clinician: Referring Briar Witherspoon: Treating Peggie Hornak/Extender: 77 in Treatment: 0 Encounter Discharge Information Items Discharge Condition: Stable Ambulatory Status: Cane Discharge Destination: Home Transportation: Private Auto Accompanied By: alone Schedule Follow-up Appointment: Yes Clinical Summary of Care: Patient Declined Electronic Signature(s) Signed: 04/20/2021 5:43:47 PM By: Hillard Danker RN, BSN Entered By: Leandrew Koyanagi on 04/20/2021 10:42:25 -------------------------------------------------------------------------------- Lower Extremity Assessment Details Patient Name: Date of Service: SA Zandra Abts, [name] 04/20/2021 7:30 A M Medical Record Number: 06/21/2021 Patient Account Number: Pecolia Ades Date of Birth/Sex: Treating RN: [birth year] (65 y.o. [id number] Primary Care Mayci Haning: 06/25/1956 Other Clinician: Referring Asiah Browder: Treating Flower Franko/Extender: 77 in Treatment: 0 Edema Assessment Assessed: [name]: No] [Right: No] Edema: [Left: Ye] [Right: s] Calf Left: Right: Point of Measurement: 39 cm From Medial Instep 31 cm Ankle Left: Right: Point of Measurement: 10 cm From Medial Instep 24 cm Knee To Floor Left: Right: From Medial Instep 40 cm Vascular Assessment Pulses: Dorsalis Pedis Palpable: [Right:Yes] Blood Pressure: Brachial: [Left:205] [Right:205] Ankle: [Left:Dorsalis Pedis: 197 0.96] [Right:Dorsalis Pedis: 228 1.11] Electronic Signature(s) Signed: 04/20/2021 5:43:47 PM By: Leandrew Koyanagi RN, BSN Entered By: Kyra Searles on 04/20/2021 08:00:43 -------------------------------------------------------------------------------- Multi Wound Chart  Details Patient Name: Date of Service: SA Zandra Abts, [name] 04/20/2021 7:30 A M Medical Record Number: 06/21/2021 Patient Account Number: Pecolia Ades Date of Birth/Sex: Treating RN: 28-Nov-1955 (65 y.o. [id number] Primary Care Georgeann Brinkman: 06/25/1956 Other Clinician: Referring Tatianna Ibbotson: Treating Ellora Varnum/Extender: 77 in Treatment: 0 Vital Signs Height(in): 71 Pulse(bpm): 80 Weight(lbs): 178 Blood Pressure(mmHg): 205/118 Body Mass Index(BMI): 25 Temperature(F): 97.9 Respiratory Rate(breaths/min): 18 Photos: [4:No Photos Right, Medial Lower Leg] [N/A:N/A N/A] Wound Location: [4:Gradually Appeared] [N/A:N/A] Wounding Event: [4:Venous Leg Ulcer] [N/A:N/A] Primary Etiology: [4:Deep Vein Thrombosis, Hypertension,] [N/A:N/A] Comorbid History: [4:Peripheral Venous Disease, Gout 02/07/2021] [N/A:N/A] Date Acquired: [4:0] [N/A:N/A] Weeks of Treatment: [4:Open] [N/A:N/A] Wound Status: [4:4.7x1.5x0.1] [N/A:N/A] Measurements L x W x D (cm) [4:5.537] [N/A:N/A] A (cm) : rea [4:0.554] [N/A:N/A] Volume (cm) : [4:Full Thickness Without Exposed] [N/A:N/A]  Classification: [4:Support Structures Medium] [N/A:N/A] Exudate A mount: [4:Serosanguineous] [N/A:N/A] Exudate Type: [4:red, brown] [N/A:N/A] Exudate Color: [4:Flat and Intact] [N/A:N/A] Wound Margin: [4:Medium (34-66%)] [N/A:N/A] Granulation Amount: [4:Pink, Pale] [N/A:N/A] Granulation Quality: [4:Medium (34-66%)] [N/A:N/A] Necrotic Amount: [4:Eschar, Adherent Slough] [N/A:N/A] Necrotic Tissue: [4:Fat Layer (Subcutaneous Tissue): Yes N/A] Exposed Structures: [4:Fascia: No Tendon: No Muscle: No Joint: No Bone: No None] [N/A:N/A] Epithelialization: [4:Compression Therapy] [N/A:N/A] Treatment Notes Electronic Signature(s) Signed: 04/20/2021 10:26:19 AM By: Geralyn Corwin DO Signed: 04/20/2021 6:16:29 PM By: Shawn Stall Entered By: Geralyn Corwin on 04/20/2021  08:47:02 -------------------------------------------------------------------------------- Multi-Disciplinary Care Plan Details Patient Name: Date of Service: [facility], [name] 04/20/2021 7:30 A M Medical Record Number: [id number] Patient Account Number: [id number] Date of Birth/Sex: Treating RN: 28-Aug-1956 (65 y.o. [name] Primary Care Felma Pfefferle: Hillard Danker Other Clinician: Referring Davetta Olliff: Treating Angee Gupton/Extender: Leandrew Koyanagi in Treatment: 0 Multidisciplinary Care Plan reviewed with physician Active Inactive Venous Leg Ulcer Nursing Diagnoses: Actual venous Insuffiency (use after diagnosis is confirmed) Knowledge deficit related to disease process and management Goals: Patient will maintain optimal edema control Date Initiated: 04/20/2021 Target Resolution Date: 05/21/2021 Goal Status: Active Patient/caregiver will verbalize understanding of disease process and disease management Date Initiated: 04/20/2021 Target Resolution Date: 05/21/2021 Goal Status: Active Interventions: Assess peripheral edema status every visit. Compression as ordered Provide education on venous insufficiency Notes: Wound/Skin Impairment Nursing Diagnoses: Impaired tissue integrity Knowledge deficit related to smoking impact on wound healing Knowledge deficit related to ulceration/compromised skin integrity Goals: Patient/caregiver will verbalize understanding of skin care regimen Date Initiated: 04/20/2021 Target Resolution Date: 05/21/2021 Goal Status: Active Ulcer/skin breakdown will have a volume reduction of 30% by week 4 Date Initiated: 04/20/2021 Target Resolution Date: 05/21/2021 Goal Status: Active Interventions: Assess patient/caregiver ability to obtain necessary supplies Assess patient/caregiver ability to perform ulcer/skin care regimen upon admission and as needed Assess ulceration(s) every visit Provide education on ulcer and skin  care Notes: Electronic Signature(s) Signed: 04/20/2021 5:43:47 PM By: Zandra Abts RN, BSN Entered By: Zandra Abts on 04/20/2021 08:40:23 -------------------------------------------------------------------------------- Pain Assessment Details Patient Name: Date of Service: [name], [name] 04/20/2021 7:30 A M Medical Record Number: [id number] Patient Account Number: [id number] Date of Birth/Sex: Treating RN: [birth year] (65 y.o. [name] Primary Care Maykel Reitter: Hillard Danker Other Clinician: Referring Ausar Georgiou: Treating Anyi Fels/Extender: Leandrew Koyanagi in Treatment: 0 Active Problems Location of Pain Severity and Description of Pain Patient Has Paino Yes Site Locations Pain Location: Pain in Ulcers With Dressing Change: Yes Duration of the Pain. Constant / Intermittento Intermittent Rate the pain. Current Pain Level: 7 Character of Pain Describe the Pain: Throbbing Pain Management and Medication Current Pain Management: Medication: Yes Cold Application: No Rest: No Massage: No Activity: No T.E.N.S.: No Heat Application: No Leg drop or elevation: No Is the Current Pain Management Adequate: Adequate How does your wound impact your activities of daily livingo Sleep: No Bathing: No Appetite: No Relationship With Others: No Bladder Continence: No Emotions: No Bowel Continence: No Work: No Toileting: No Drive: No Dressing: No Hobbies: No Electronic Signature(s) Signed: 04/20/2021 5:43:47 PM By: Zandra Abts RN, BSN Entered By: Zandra Abts on 04/20/2021 07:57:58 -------------------------------------------------------------------------------- Patient/Caregiver Education Details Patient Name: Date of Service: [name], [name] 7/12/2022andnbsp7:30 A M Medical Record Number: [id number] Patient Account Number: [id number] Date of Birth/Gender: Treating RN: August 31, 1956 (65 y.o. [name] Primary Care  Physician: Hillard Danker Other Clinician: Referring Physician: Treating Physician/Extender: Leandrew Koyanagi in Treatment: 0  Education Assessment Education Provided To: Patient Education Topics Provided Wound/Skin Impairment: Methods: Explain/Verbal Responses: State content correctly Electronic Signature(s) Signed: 04/20/2021 5:43:47 PM By: Zandra AbtsLynch, Shatara RN, BSN Entered By: Zandra AbtsLynch, Shatara on 04/20/2021 08:42:24 -------------------------------------------------------------------------------- Wound Assessment Details Patient Name: Date of Service: SA Pecolia AdesNDERS, [name] 04/20/2021 7:30 A M Medical Record Number: [id number] Patient Account Number: [id number] Date of Birth/Sex: Treating RN: 1956-07-05 (65 y.o. Elizebeth KollerM) Lynch, Shatara Primary Care Keviana Guida: Hillard Dankerrawford, Elizabeth Other Clinician: Referring Zora Glendenning: Treating Yonah Tangeman/Extender: Leandrew KoyanagiHoffman, Jessica Crawford, Elizabeth Weeks in Treatment: 0 Wound Status Wound Number: 4 Primary Venous Leg Ulcer Etiology: Wound Location: Right, Medial Lower Leg Wound Status: Open Wounding Event: Gradually Appeared Comorbid Deep Vein Thrombosis, Hypertension, Peripheral Venous Date Acquired: 02/07/2021 History: Disease, Gout Weeks Of Treatment: 0 Clustered Wound: No Photos Wound Measurements Length: (cm) 4.7 Width: (cm) 1.5 Depth: (cm) 0.1 Area: (cm) 5.537 Volume: (cm) 0.554 % Reduction in Area: 0% % Reduction in Volume: 0% Epithelialization: None Tunneling: No Undermining: No Wound Description Classification: Full Thickness Without Exposed Support Structures Wound Margin: Flat and Intact Exudate Amount: Medium Exudate Type: Serosanguineous Exudate Color: red, brown Foul Odor After Cleansing: No Slough/Fibrino Yes Wound Bed Granulation Amount: Medium (34-66%) Exposed Structure Granulation Quality: Pink, Pale Fascia Exposed: No Necrotic Amount: Medium (34-66%) Fat Layer (Subcutaneous Tissue)  Exposed: Yes Necrotic Quality: Eschar, Adherent Slough Tendon Exposed: No Muscle Exposed: No Joint Exposed: No Bone Exposed: No Treatment Notes Wound #4 (Lower Leg) Wound Laterality: Right, Medial Cleanser Soap and Water Discharge Instruction: May shower and wash wound with dial antibacterial soap and water prior to dressing change. Wound Cleanser Discharge Instruction: Cleanse the wound with wound cleanser prior to applying a clean dressing using gauze sponges, not tissue or cotton balls. Peri-Wound Care Sween Lotion (Moisturizing lotion) Discharge Instruction: Apply moisturizing lotion as directed Topical Primary Dressing KerraCel Ag Gelling Fiber Dressing, 4x5 in (silver alginate) Discharge Instruction: Apply silver alginate to wound bed as instructed Secondary Dressing Woven Gauze Sponge, Non-Sterile 4x4 in Discharge Instruction: Apply over primary dressing as directed. ABD Pad, 8x10 Discharge Instruction: Apply over primary dressing as directed. Secured With Compression Wrap ThreePress (3 layer compression wrap) Discharge Instruction: Apply three layer compression as directed. Compression Stockings Add-Ons Electronic Signature(s) Signed: 04/20/2021 5:43:47 PM By: Zandra AbtsLynch, Shatara RN, BSN Signed: 04/26/2021 3:49:49 PM By: Karl Itoawkins, Destiny Entered By: Karl Itoawkins, Destiny on 04/20/2021 16:48:08 -------------------------------------------------------------------------------- Vitals Details Patient Name: Date of Service: SA Pecolia AdesNDERS, [name] 04/20/2021 7:30 A M Medical Record Number: [id number] Patient Account Number: [id number] Date of Birth/Sex: Treating RN: 1956-07-05 (65 y.o. Elizebeth KollerM) Lynch, Shatara Primary Care Hisayo Delossantos: Hillard Dankerrawford, Elizabeth Other Clinician: Referring Lazer Wollard: Treating Taetum Flewellen/Extender: Leandrew KoyanagiHoffman, Jessica Crawford, Elizabeth Weeks in Treatment: 0 Vital Signs Time Taken: 07:45 Temperature (F): 97.9 Height (in): 71 Pulse (bpm): 80 Source: Stated Respiratory  Rate (breaths/min): 18 Weight (lbs): 178 Blood Pressure (mmHg): 205/118 Source: Stated Reference Range: 80 - 120 mg / dl Body Mass Index (BMI): 24.8 Electronic Signature(s) Signed: 04/20/2021 5:43:47 PM By: Zandra AbtsLynch, Shatara RN, BSN Entered By: Zandra AbtsLynch, Shatara on 04/20/2021 07:59:52

## 2021-04-27 ENCOUNTER — Other Ambulatory Visit: Payer: Self-pay

## 2021-04-27 ENCOUNTER — Encounter (HOSPITAL_BASED_OUTPATIENT_CLINIC_OR_DEPARTMENT_OTHER): Payer: BLUE CROSS/BLUE SHIELD | Admitting: Internal Medicine

## 2021-04-27 DIAGNOSIS — L97819 Non-pressure chronic ulcer of other part of right lower leg with unspecified severity: Secondary | ICD-10-CM

## 2021-04-27 DIAGNOSIS — I872 Venous insufficiency (chronic) (peripheral): Secondary | ICD-10-CM | POA: Diagnosis not present

## 2021-04-27 DIAGNOSIS — F17210 Nicotine dependence, cigarettes, uncomplicated: Secondary | ICD-10-CM | POA: Diagnosis not present

## 2021-04-27 DIAGNOSIS — I10 Essential (primary) hypertension: Secondary | ICD-10-CM

## 2021-04-27 NOTE — Progress Notes (Signed)
Troy Bird, Troy Bird ([id number]) Visit Report for 04/27/2021 Arrival Information Details Patient Name: Date of Service: Troy Bird 04/27/2021 9:00 A M Medical Record Number: [id number] Patient Account Number: [id number] Date of Birth/Sex: Treating RN: 01-Oct-1956 (65 y.o. Male) Elesa Hacker, [place] Primary Care Brooke Payes: Hillard Danker Other Clinician: Referring Jenny Omdahl: Treating Keelee Yankey/Extender: Leandrew Koyanagi in Treatment: 1 Visit Information History Since Last Visit Added or deleted any medications: No Patient Arrived: Ambulatory Any new allergies or adverse reactions: No Arrival Time: 09:07 Had a fall or experienced change in No Accompanied By: self activities of daily living that may affect Transfer Assistance: None risk of falls: Patient Identification Verified: Yes Signs or symptoms of abuse/neglect since last visito No Secondary Verification Process Completed: Yes Hospitalized since last visit: No Patient Requires Transmission-Based Precautions: No Implantable device outside of the clinic excluding No Patient Has Alerts: No cellular tissue based products placed in the center since last visit: Has Dressing in Place as Prescribed: Yes Pain Present Now: Yes Electronic Signature(s) Signed: 04/27/2021 9:35:14 AM By: Karl Ito Entered By: Karl Ito on 04/27/2021 09:08:04 -------------------------------------------------------------------------------- Compression Therapy Details Patient Name: Date of Service: SA Troy Bird, RO NA Bird 04/27/2021 9:00 A M Medical Record Number: [id number] Patient Account Number: [id number] Date of Birth/Sex: Treating RN: 07/30/1956 (64 y.o. Male) Shawn Stall Primary Care Tavio Biegel: Hillard Danker Other Clinician: Referring Loys Hoselton: Treating Rodgerick Gilliand/Extender: Leandrew Koyanagi in Treatment: 1 Compression Therapy Performed for Wound Assessment: Wound #4 Right,Medial Lower  Leg Performed By: Clinician Zenaida Deed, RN Compression Type: Three Layer Post Procedure Diagnosis Same as Pre-procedure Electronic Signature(s) Signed: 04/27/2021 6:09:20 PM By: Shawn Stall Entered By: Shawn Stall on 04/27/2021 09:17:11 -------------------------------------------------------------------------------- Encounter Discharge Information Details Patient Name: Date of Service: Troy Bird, RO NA Bird 04/27/2021 9:00 A M Medical Record Number: [id number] Patient Account Number: [id number] Date of Birth/Sex: Treating RN: December 13, 1955 (64 y.o. Male) Zenaida Deed Primary Care Renaud Celli: Hillard Danker Other Clinician: Referring Avan Gullett: Treating Jaeley Wiker/Extender: Leandrew Koyanagi in Treatment: 1 Encounter Discharge Information Items Discharge Condition: Stable Ambulatory Status: Ambulatory Discharge Destination: Home Transportation: Private Auto Accompanied By: self Schedule Follow-up Appointment: Yes Clinical Summary of Care: Patient Declined Electronic Signature(s) Signed: 04/27/2021 6:27:59 PM By: Zenaida Deed RN, BSN Entered By: Zenaida Deed on 04/27/2021 12:22:52 -------------------------------------------------------------------------------- Lower Extremity Assessment Details Patient Name: Date of Service: SA Troy Bird, RO NA Bird 04/27/2021 9:00 A M Medical Record Number: [id number] Patient Account Number: [id number] Date of Birth/Sex: Treating RN: 1955-12-07 (64 y.o. Male) Zenaida Deed Primary Care Coreon Simkins: Hillard Danker Other Clinician: Referring Tauna Macfarlane: Treating Kristoph Sattler/Extender: Leandrew Koyanagi in Treatment: 1 Edema Assessment Assessed: [Left: No] [Right: No] Edema: [Left: Ye] [Right: s] Calf Left: Right: Point of Measurement: 39 cm From Medial Instep 34.8 cm Ankle Left: Right: Point of Measurement: 10 cm From Medial Instep 22.4 cm Vascular Assessment Pulses: Dorsalis  Pedis Palpable: [Right:Yes] Electronic Signature(s) Signed: 04/27/2021 6:27:59 PM By: Zenaida Deed RN, BSN Entered By: Zenaida Deed on 04/27/2021 09:12:40 -------------------------------------------------------------------------------- Multi Wound Chart Details Patient Name: Date of Service: Troy Bird, RO NA Bird 04/27/2021 9:00 A M Medical Record Number: [id number] Patient Account Number: [id number] Date of Birth/Sex: Treating RN: 1956-06-12 (64 y.o. Male) Shawn Stall Primary Care Briley Sulton: Hillard Danker Other Clinician: Referring Sota Hetz: Treating Sehaj Kolden/Extender: Leandrew Koyanagi in Treatment: 1 Vital Signs Height(in): 71 Pulse(bpm): 86 Weight(lbs): 178 Blood Pressure(mmHg): 155/92 Body Mass Index(BMI): 25 Temperature(F): 98.4 Respiratory Rate(breaths/min): 18 Photos: [4:No Photos Right, Medial Lower Leg] [N/A:N/A N/A]  Wound Location: [4:Gradually Appeared] [N/A:N/A] Wounding Event: [4:Venous Leg Ulcer] [N/A:N/A] Primary Etiology: [4:Deep Vein Thrombosis, Hypertension, N/A] Comorbid History: [4:Peripheral Venous Disease, Gout 02/07/2021] [N/A:N/A] Date Acquired: [4:1] [N/A:N/A] Weeks of Treatment: [4:Open] [N/A:N/A] Wound Status: [4:1.1x0.5x0.1] [N/A:N/A] Measurements L x W x D (cm) [4:0.432] [N/A:N/A] A (cm) : rea [4:0.043] [N/A:N/A] Volume (cm) : [4:92.20%] [N/A:N/A] % Reduction in Area: [4:92.20%] [N/A:N/A] % Reduction in Volume: [4:Full Thickness Without Exposed] [N/A:N/A] Classification: [4:Support Structures Small] [N/A:N/A] Exudate Amount: [4:Serosanguineous] [N/A:N/A] Exudate Type: [4:red, brown] [N/A:N/A] Exudate Color: [4:Flat and Intact] [N/A:N/A] Wound Margin: [4:Large (67-100%)] [N/A:N/A] Granulation Amount: [4:Red] [N/A:N/A] Granulation Quality: [4:None Present (0%)] [N/A:N/A] Necrotic Amount: [4:Fat Layer (Subcutaneous Tissue): Yes N/A] Exposed Structures: [4:Fascia: No Tendon: No Muscle: No Joint: No Bone: No  Medium (34-66%)] [N/A:N/A] Epithelialization: [4:Compression Therapy] [N/A:N/A] Treatment Notes Electronic Signature(s) Signed: 04/27/2021 9:28:49 AM By: Geralyn Corwin DO Signed: 04/27/2021 6:09:20 PM By: Shawn Stall Entered By: Geralyn Corwin on 04/27/2021 09:21:59 -------------------------------------------------------------------------------- Multi-Disciplinary Care Plan Details Patient Name: Date of Service: Troy Bird, RO NA Bird 04/27/2021 9:00 A M Medical Record Number: [id number] Patient Account Number: [id number] Date of Birth/Sex: Treating RN: 09/03/1956 (64 y.o. Male) Shawn Stall Primary Care Kida Digiulio: Hillard Danker Other Clinician: Referring Florentina Marquart: Treating Ivann Trimarco/Extender: Leandrew Koyanagi in Treatment: 1 Multidisciplinary Care Plan reviewed with physician Active Inactive Venous Leg Ulcer Nursing Diagnoses: Actual venous Insuffiency (use after diagnosis is confirmed) Knowledge deficit related to disease process and management Goals: Patient will maintain optimal edema control Date Initiated: 04/20/2021 Target Resolution Date: 06/11/2021 Goal Status: Active Patient/caregiver will verbalize understanding of disease process and disease management Date Initiated: 04/20/2021 Target Resolution Date: 06/11/2021 Goal Status: Active Interventions: Assess peripheral edema status every visit. Compression as ordered Provide education on venous insufficiency Notes: Wound/Skin Impairment Nursing Diagnoses: Impaired tissue integrity Knowledge deficit related to smoking impact on wound healing Knowledge deficit related to ulceration/compromised skin integrity Goals: Patient/caregiver will verbalize understanding of skin care regimen Date Initiated: 04/20/2021 Target Resolution Date: 05/21/2021 Goal Status: Active Ulcer/skin breakdown will have a volume reduction of 30% by week 4 Date Initiated: 04/20/2021 Target Resolution Date:  05/21/2021 Goal Status: Active Interventions: Assess patient/caregiver ability to obtain necessary supplies Assess patient/caregiver ability to perform ulcer/skin care regimen upon admission and as needed Assess ulceration(s) every visit Provide education on ulcer and skin care Notes: Electronic Signature(s) Signed: 04/27/2021 6:09:20 PM By: Shawn Stall Entered By: Shawn Stall on 04/27/2021 09:16:35 -------------------------------------------------------------------------------- Pain Assessment Details Patient Name: Date of Service: Troy Bird, RO NA Bird 04/27/2021 9:00 A M Medical Record Number: [id number] Patient Account Number: [id number] Date of Birth/Sex: Treating RN: [birth year] (65 y.o. Male) Shawn Stall Primary Care Asriel Westrup: Hillard Danker Other Clinician: Referring Lyndell Gillyard: Treating Antoniette Peake/Extender: Leandrew Koyanagi in Treatment: 1 Active Problems Location of Pain Severity and Description of Pain Patient Has Paino Yes Site Locations Rate the pain. Rate the pain. Current Pain Level: 8 Pain Management and Medication Current Pain Management: Electronic Signature(s) Signed: 04/27/2021 9:35:14 AM By: Karl Ito Signed: 04/27/2021 6:09:20 PM By: Shawn Stall Entered By: Karl Ito on 04/27/2021 09:08:33 -------------------------------------------------------------------------------- Patient/Caregiver Education Details Patient Name: Date of Service: Troy Bird, RO NA Bird 7/19/2022andnbsp9:00 A M Medical Record Number: [id number] Patient Account Number: [id number] Date of Birth/Gender: Treating RN: [birth year] (65 y.o. Male) Shawn Stall Primary Care Physician: Hillard Danker Other Clinician: Referring Physician: Treating Physician/Extender: Leandrew Koyanagi in Treatment: 1 Education Assessment Education Provided To: Patient Education Topics Provided Venous: Handouts: Managing Venous  Disease and Related  Ulcers Methods: Explain/Verbal Responses: Reinforcements needed Electronic Signature(s) Signed: 04/27/2021 6:09:20 PM By: Shawn Stall Entered By: Shawn Stall on 04/27/2021 09:16:49 -------------------------------------------------------------------------------- Wound Assessment Details Patient Name: Date of Service: SA Troy Bird, RO NA Bird 04/27/2021 9:00 A M Medical Record Number: [id number] Patient Account Number: [id number] Date of Birth/Sex: Treating RN: Nov 01, 1955 (64 y.o. Male) Shawn Stall Primary Care Meila Berke: Hillard Danker Other Clinician: Referring Lailana Shira: Treating Jami Ohlin/Extender: Leandrew Koyanagi in Treatment: 1 Wound Status Wound Number: 4 Primary Venous Leg Ulcer Etiology: Wound Location: Right, Medial Lower Leg Wound Status: Open Wounding Event: Gradually Appeared Comorbid Deep Vein Thrombosis, Hypertension, Peripheral Venous Date Acquired: 02/07/2021 History: Disease, Gout Weeks Of Treatment: 1 Clustered Wound: No Photos Wound Measurements Length: (cm) 1.1 Width: (cm) 0.5 Depth: (cm) 0.1 Area: (cm) 0.432 Volume: (cm) 0.043 % Reduction in Area: 92.2% % Reduction in Volume: 92.2% Epithelialization: Medium (34-66%) Tunneling: No Undermining: No Wound Description Classification: Full Thickness Without Exposed Support Structures Wound Margin: Flat and Intact Exudate Amount: Small Exudate Type: Serosanguineous Exudate Color: red, brown Foul Odor After Cleansing: No Slough/Fibrino Yes Wound Bed Granulation Amount: Large (67-100%) Exposed Structure Granulation Quality: Red Fascia Exposed: No Necrotic Amount: None Present (0%) Fat Layer (Subcutaneous Tissue) Exposed: Yes Tendon Exposed: No Muscle Exposed: No Joint Exposed: No Bone Exposed: No Treatment Notes Wound #4 (Lower Leg) Wound Laterality: Right, Medial Cleanser Soap and Water Discharge Instruction: May shower and wash wound with dial  antibacterial soap and water prior to dressing change. Wound Cleanser Discharge Instruction: Cleanse the wound with wound cleanser prior to applying a clean dressing using gauze sponges, not tissue or cotton balls. Peri-Wound Care Sween Lotion (Moisturizing lotion) Discharge Instruction: Apply moisturizing lotion as directed Topical Primary Dressing KerraCel Ag Gelling Fiber Dressing, 4x5 in (silver alginate) Discharge Instruction: Apply silver alginate to wound bed as instructed Secondary Dressing Woven Gauze Sponge, Non-Sterile 4x4 in Discharge Instruction: Apply over primary dressing as directed. ABD Pad, 8x10 Discharge Instruction: Apply over primary dressing as directed. Secured With Compression Wrap ThreePress (3 layer compression wrap) Discharge Instruction: Apply three layer compression as directed. Compression Stockings Add-Ons Electronic Signature(s) Signed: 04/27/2021 5:17:35 PM By: Karl Ito Signed: 04/27/2021 6:09:20 PM By: Shawn Stall Entered By: Karl Ito on 04/27/2021 16:35:07 -------------------------------------------------------------------------------- Vitals Details Patient Name: Date of Service: SA Troy Bird, RO NA Bird 04/27/2021 9:00 A M Medical Record Number: [id number] Patient Account Number: [id number] Date of Birth/Sex: Treating RN: [birth year] (64 y.o. Male) Elesa Hacker, [place] Primary Care Shaneece Stockburger: Hillard Danker Other Clinician: Referring Shrey Boike: Treating Yissel Habermehl/Extender: Leandrew Koyanagi in Treatment: 1 Vital Signs Time Taken: 09:08 Temperature (F): 98.4 Height (in): 71 Pulse (bpm): 86 Weight (lbs): 178 Respiratory Rate (breaths/min): 18 Body Mass Index (BMI): 24.8 Blood Pressure (mmHg): 155/92 Reference Range: 80 - 120 mg / dl Electronic Signature(s) Signed: 04/27/2021 9:35:14 AM By: Karl Ito Entered By: Karl Ito on 04/27/2021 29:52:84

## 2021-04-27 NOTE — Progress Notes (Signed)
Troy Bird, Troy Bird ([id number]) Visit Report for 04/27/2021 Chief Complaint Document Details Patient Name: Date of Service: [place], [name] NA LD 04/27/2021 9:00 A M Medical Record Number: [id number] Patient Account Number: [id number] Date of Birth/Sex: Treating RN: 1955/12/31 (64 y.o. Male) Shawn Stall Primary Care Provider: Hillard Danker Other Clinician: Referring Provider: Treating Provider/Extender: Leandrew Koyanagi in Treatment: 1 Information Obtained from: Patient Chief Complaint right lower extremity wound Electronic Signature(s) Signed: 04/27/2021 9:28:49 AM By: Geralyn Corwin DO Entered By: Geralyn Corwin on 04/27/2021 09:22:07 -------------------------------------------------------------------------------- HPI Details Patient Name: Date of Service: [name], [name] 04/27/2021 9:00 A M Medical Record Number: [id number] Patient Account Number: [id number] Date of Birth/Sex: Treating RN: October 25, 1955 (64 y.o. Male) Shawn Stall Primary Care Provider: Hillard Danker Other Clinician: Referring Provider: Treating Provider/Extender: Leandrew Koyanagi in Treatment: 1 History of Present Illness HPI Description: Admission 7/12 Troy Bird is a 65 year old male with a past medical history of chronic venous insufficiency and hypertension that presents to the clinic for a right lower extremity wound. He has experienced a wound to this location about 3 years ago and was seen in our clinic for treatment. His current wound started 2 months ago. He has compression stockings and states he has been wearing them. He has been on his feet more often due to his job working at [employer]. He has been using Neosporin to the wound. He visited the ED for this issue and was given doxycycline for possible cellulitis. He also reports having a DVT study done that did not show a clot. He currently denies signs of infection. 7/19; patient  presents for 1 week follow-up. He reports improvement in wound healing and pain. He denies signs of infection. He had bedbugs on his clothing during the encounter. Electronic Signature(s) Signed: 04/27/2021 9:28:49 AM By: Geralyn Corwin DO Entered By: Geralyn Corwin on 04/27/2021 09:23:17 -------------------------------------------------------------------------------- Physical Exam Details Patient Name: Date of Service: SA NDERS, [name] 04/27/2021 9:00 A M Medical Record Number: [id number] Patient Account Number: [id number] Date of Birth/Sex: Treating RN: 30-Aug-1956 (65 y.o. Male) Shawn Stall Primary Care Provider: Hillard Danker Other Clinician: Referring Provider: Treating Provider/Extender: Leandrew Koyanagi in Treatment: 1 Constitutional respirations regular, non-labored and within target range for patient.. Cardiovascular 2+ dorsalis pedis/posterior tibialis pulses. Psychiatric pleasant and cooperative. Notes Right lower extremity: T the lower medial aspect there is an open wound with granulation tissue present. There is also serous drainage that is dried around the o wound bed. excellent edema control. Electronic Signature(s) Signed: 04/27/2021 9:28:49 AM By: Geralyn Corwin DO Entered By: Geralyn Corwin on 04/27/2021 09:24:37 -------------------------------------------------------------------------------- Physician Orders Details Patient Name: Date of Service: SA NDERS, [name] 04/27/2021 9:00 A M Medical Record Number: [id number] Patient Account Number: [id number] Date of Birth/Sex: Treating RN: Jan 15, 1956 (64 y.o. Male) Shawn Stall Primary Care Provider: Hillard Danker Other Clinician: Referring Provider: Treating Provider/Extender: Leandrew Koyanagi in Treatment: 1 Verbal / Phone Orders: No Diagnosis Coding ICD-10 Coding Code Description L97.819 Non-pressure chronic ulcer of other part of right  lower leg with unspecified severity I87.2 Venous insufficiency (chronic) (peripheral) I10 Essential (primary) hypertension Follow-up Appointments ppointment in 1 week. - with Dr. Mikey Bussing Return A Bathing/ Shower/ Hygiene May shower with protection but do not get wound dressing(s) wet. Edema Control - Lymphedema / SCD / Other Elevate legs to the level of the heart or above for 30 minutes daily and/or when sitting, a frequency of: - throughout the day  Avoid standing for long periods of time. Exercise regularly Other Edema Control Orders/Instructions: - bring in compression garment next week. Wound Treatment Wound #4 - Lower Leg Wound Laterality: Right, Medial Cleanser: Soap and Water 1 x Per Week Discharge Instructions: May shower and wash wound with dial antibacterial soap and water prior to dressing change. Cleanser: Wound Cleanser 1 x Per Week Discharge Instructions: Cleanse the wound with wound cleanser prior to applying a clean dressing using gauze sponges, not tissue or cotton balls. Peri-Wound Care: Sween Lotion (Moisturizing lotion) 1 x Per Week Discharge Instructions: Apply moisturizing lotion as directed Prim Dressing: KerraCel Ag Gelling Fiber Dressing, 4x5 in (silver alginate) 1 x Per Week ary Discharge Instructions: Apply silver alginate to wound bed as instructed Secondary Dressing: Woven Gauze Sponge, Non-Sterile 4x4 in 1 x Per Week Discharge Instructions: Apply over primary dressing as directed. Secondary Dressing: ABD Pad, 8x10 1 x Per Week Discharge Instructions: Apply over primary dressing as directed. Compression Wrap: ThreePress (3 layer compression wrap) 1 x Per Week Discharge Instructions: Apply three layer compression as directed. Electronic Signature(s) Signed: 04/27/2021 9:28:49 AM By: Geralyn Corwin DO Entered By: Geralyn Corwin on 04/27/2021 09:24:54 -------------------------------------------------------------------------------- Problem List  Details Patient Name: Date of Service: [name], [name] 04/27/2021 9:00 A M Medical Record Number: [id number] Patient Account Number: [id number] Date of Birth/Sex: Treating RN: [birth year] (64 y.o. Male) Shawn Stall Primary Care Provider: Hillard Danker Other Clinician: Referring Provider: Treating Provider/Extender: Leandrew Koyanagi in Treatment: 1 Active Problems ICD-10 Encounter Code Description Active Date MDM Diagnosis L97.819 Non-pressure chronic ulcer of other part of right lower leg with unspecified 04/20/2021 No Yes severity I87.2 Venous insufficiency (chronic) (peripheral) 04/20/2021 No Yes I10 Essential (primary) hypertension 04/20/2021 No Yes Inactive Problems Resolved Problems Electronic Signature(s) Signed: 04/27/2021 9:28:49 AM By: Geralyn Corwin DO Entered By: Geralyn Corwin on 04/27/2021 09:21:53 -------------------------------------------------------------------------------- Progress Note Details Patient Name: Date of Service: SA NDERS, [name] 04/27/2021 9:00 A M Medical Record Number: [id number] Patient Account Number: [id number] Date of Birth/Sex: Treating RN: 29-Nov-1955 (64 y.o. Male) Shawn Stall Primary Care Provider: Hillard Danker Other Clinician: Referring Provider: Treating Provider/Extender: Leandrew Koyanagi in Treatment: 1 Subjective Chief Complaint Information obtained from Patient right lower extremity wound History of Present Illness (HPI) Admission 7/12 [name] is a 65 year old male with a past medical history of chronic venous insufficiency and hypertension that presents to the clinic for a right lower extremity wound. He has experienced a wound to this location about 3 years ago and was seen in our clinic for treatment. His current wound started 2 months ago. He has compression stockings and states he has been wearing them. He has been on his feet more often  due to his job working at [employer]. He has been using Neosporin to the wound. He visited the ED for this issue and was given doxycycline for possible cellulitis. He also reports having a DVT study done that did not show a clot. He currently denies signs of infection. 7/19; patient presents for 1 week follow-up. He reports improvement in wound healing and pain. He denies signs of infection. He had bedbugs on his clothing during the encounter. Patient History Information obtained from Patient. Family History Cancer - Siblings, Diabetes - Father, Heart Disease - Father, Hypertension - Father, Thyroid Problems - Father, No family history of Hereditary Spherocytosis, Kidney Disease, Lung Disease, Seizures, Stroke, Tuberculosis. Social History Current every day smoker - half pack per day, Marital Status -  Married, Alcohol Use - Rarely, Drug Use - No History, Caffeine Use - Daily - coffee. Medical History Eyes Denies history of Cataracts, Glaucoma, Optic Neuritis Ear/Nose/Mouth/Throat Denies history of Chronic sinus problems/congestion, Middle ear problems Hematologic/Lymphatic Denies history of Anemia, Hemophilia, Lymphedema, Sickle Cell Disease Respiratory Denies history of Aspiration, Asthma, Chronic Obstructive Pulmonary Disease (COPD), Pneumothorax, Sleep Apnea, Tuberculosis Cardiovascular Patient has history of Deep Vein Thrombosis - right, Hypertension, Peripheral Venous Disease Denies history of Angina, Arrhythmia, Congestive Heart Failure, Coronary Artery Disease, Hypotension, Myocardial Infarction, Peripheral Arterial Disease, Phlebitis, Vasculitis Gastrointestinal Denies history of Cirrhosis , Colitis, Crohnoos, Hepatitis A, Hepatitis B, Hepatitis C Endocrine Denies history of Type I Diabetes, Type II Diabetes Genitourinary Denies history of End Stage Renal Disease Immunological Denies history of Lupus Erythematosus, Raynaudoos, Scleroderma Integumentary (Skin) Denies history  of History of Burn Musculoskeletal Patient has history of Gout Denies history of Rheumatoid Arthritis, Osteoarthritis, Osteomyelitis Neurologic Denies history of Dementia, Neuropathy, Quadriplegia, Paraplegia, Seizure Disorder Oncologic Denies history of Received Chemotherapy, Received Radiation Psychiatric Denies history of Anorexia/bulimia, Confinement Anxiety Hospitalization/Surgery History - [place], [place]. [place], [place]. Objective Constitutional respirations regular, non-labored and within target range for patient.. Vitals Time Taken: 9:08 AM, Height: 71 in, Weight: 178 lbs, BMI: 24.8, Temperature: 98.4 F, Pulse: 86 bpm, Respiratory Rate: 18 breaths/min, Blood Pressure: 155/92 mmHg. Cardiovascular 2+ dorsalis pedis/posterior tibialis pulses. Psychiatric pleasant and cooperative. General Notes: Right lower extremity: T the lower medial aspect there is an open wound with granulation tissue present. There is also serous drainage that is o dried around the wound bed. excellent edema control. Integumentary (Hair, Skin) Wound #4 status is Open. Original cause of wound was Gradually Appeared. The date acquired was: 02/07/2021. The wound has been in treatment 1 weeks. The wound is located on the Right,Medial Lower Leg. The wound measures 1.1cm length x 0.5cm width x 0.1cm depth; 0.432cm^2 area and 0.043cm^3 volume. There is Fat Layer (Subcutaneous Tissue) exposed. There is no tunneling or undermining noted. There is a small amount of serosanguineous drainage noted. The wound margin is flat and intact. There is large (67-100%) red granulation within the wound bed. There is no necrotic tissue within the wound bed. Assessment Active Problems ICD-10 Non-pressure chronic ulcer of other part of right lower leg with unspecified severity Venous insufficiency (chronic) (peripheral) Essential (primary) hypertension Patient's wound has shown improvement in size and appearance since last  clinic visit. He has excellent edema control on exam. No signs of infection. I recommended continuing silver alginate with 3 layer compression. He states he has compression Velcro wraps at home. I asked him to bring these next week as his wound may be healed by then. Procedures Wound #4 Pre-procedure diagnosis of Wound #4 is a Venous Leg Ulcer located on the Right,Medial Lower Leg . There was a Three Layer Compression Therapy Procedure by Zenaida Deed, RN. Post procedure Diagnosis Wound #4: Same as Pre-Procedure Plan Follow-up Appointments: Return Appointment in 1 week. - with Dr. Mikey Bussing Bathing/ Shower/ Hygiene: May shower with protection but do not get wound dressing(s) wet. Edema Control - Lymphedema / SCD / Other: Elevate legs to the level of the heart or above for 30 minutes daily and/or when sitting, a frequency of: - throughout the day Avoid standing for long periods of time. Exercise regularly Other Edema Control Orders/Instructions: - bring in compression garment next week. WOUND #4: - Lower Leg Wound Laterality: Right, Medial Cleanser: Soap and Water 1 x Per Week/ Discharge Instructions: May shower and wash wound  with dial antibacterial soap and water prior to dressing change. Cleanser: Wound Cleanser 1 x Per Week/ Discharge Instructions: Cleanse the wound with wound cleanser prior to applying a clean dressing using gauze sponges, not tissue or cotton balls. Peri-Wound Care: Sween Lotion (Moisturizing lotion) 1 x Per Week/ Discharge Instructions: Apply moisturizing lotion as directed Prim Dressing: KerraCel Ag Gelling Fiber Dressing, 4x5 in (silver alginate) 1 x Per Week/ ary Discharge Instructions: Apply silver alginate to wound bed as instructed Secondary Dressing: Woven Gauze Sponge, Non-Sterile 4x4 in 1 x Per Week/ Discharge Instructions: Apply over primary dressing as directed. Secondary Dressing: ABD Pad, 8x10 1 x Per Week/ Discharge Instructions: Apply over primary  dressing as directed. Com pression Wrap: ThreePress (3 layer compression wrap) 1 x Per Week/ Discharge Instructions: Apply three layer compression as directed. 1. Continue silver alginate with 3 layer compression 2. Follow-up in 1 week Electronic Signature(s) Signed: 04/27/2021 9:28:49 AM By: Geralyn CorwinHoffman, Shaughnessy Gethers DO Entered By: Geralyn CorwinHoffman, Taylorann Tkach on 04/27/2021 09:28:11 -------------------------------------------------------------------------------- HxROS Details Patient Name: Date of Service: SA NDERS, [name] 04/27/2021 9:00 A M Medical Record Number: [id number] Patient Account Number: [id number] Date of Birth/Sex: Treating RN: Feb 22, 1956 (64 y.o. Male) Shawn Stalleaton, Bobbi Primary Care Provider: Hillard Dankerrawford, Elizabeth Other Clinician: Referring Provider: Treating Provider/Extender: Leandrew KoyanagiHoffman, Eulas Schweitzer Crawford, Elizabeth Weeks in Treatment: 1 Information Obtained From Patient Eyes Medical History: Negative for: Cataracts; Glaucoma; Optic Neuritis Ear/Nose/Mouth/Throat Medical History: Negative for: Chronic sinus problems/congestion; Middle ear problems Hematologic/Lymphatic Medical History: Negative for: Anemia; Hemophilia; Lymphedema; Sickle Cell Disease Respiratory Medical History: Negative for: Aspiration; Asthma; Chronic Obstructive Pulmonary Disease (COPD); Pneumothorax; Sleep Apnea; Tuberculosis Cardiovascular Medical History: Positive for: Deep Vein Thrombosis - right; Hypertension; Peripheral Venous Disease Negative for: Angina; Arrhythmia; Congestive Heart Failure; Coronary Artery Disease; Hypotension; Myocardial Infarction; Peripheral Arterial Disease; Phlebitis; Vasculitis Gastrointestinal Medical History: Negative for: Cirrhosis ; Colitis; Crohns; Hepatitis A; Hepatitis B; Hepatitis C Endocrine Medical History: Negative for: Type I Diabetes; Type II Diabetes Genitourinary Medical History: Negative for: End Stage Renal Disease Immunological Medical History: Negative for:  Lupus Erythematosus; Raynauds; Scleroderma Integumentary (Skin) Medical History: Negative for: History of Burn Musculoskeletal Medical History: Positive for: Gout Negative for: Rheumatoid Arthritis; Osteoarthritis; Osteomyelitis Neurologic Medical History: Negative for: Dementia; Neuropathy; Quadriplegia; Paraplegia; Seizure Disorder Oncologic Medical History: Negative for: Received Chemotherapy; Received Radiation Psychiatric Medical History: Negative for: Anorexia/bulimia; Confinement Anxiety Immunizations Pneumococcal Vaccine: Received Pneumococcal Vaccination: No Implantable Devices No devices added Hospitalization / Surgery History [place] Family and Social History Cancer: Yes - Siblings; Diabetes: Yes - Father; Heart Disease: Yes - Father; Hereditary Spherocytosis: No; Hypertension: Yes - Father; Kidney Disease: No; Lung Disease: No; Seizures: No; Stroke: No; Thyroid Problems: Yes - Father; Tuberculosis: No; Current every day smoker - half pack per day; Marital Status - Married; Alcohol Use: Rarely; Drug Use: No History; Caffeine Use: Daily - coffee; Financial Concerns: No; Food, Clothing or Shelter Needs: No; Transportation Concerns: No Electronic Signature(s) Signed: 04/27/2021 9:28:49 AM By: Geralyn CorwinHoffman, Elynor Kallenberger DO Signed: 04/27/2021 6:09:20 PM By: Shawn Stalleaton, Bobbi Entered By: Geralyn CorwinHoffman, Kha Hari on 04/27/2021 09:23:25 -------------------------------------------------------------------------------- SuperBill Details Patient Name: Date of Service: SA Pecolia AdesNDERS, [name] 04/27/2021 Medical Record Number: [id number] Patient Account Number: [id number] Date of Birth/Sex: Treating RN: Feb 22, 1956 [age] y.o. Male) Shawn Stalleaton, Bobbi Primary Care Provider: Hillard Dankerrawford, Elizabeth Other Clinician: Referring Provider: Treating Provider/Extender: Leandrew KoyanagiHoffman, Krystian Ferrentino Crawford, Elizabeth Weeks in Treatment: 1 Diagnosis Coding ICD-10 Codes Code  Description [id number] Non-pressure chronic ulcer of other part of right lower leg with unspecified  severity I87.2 Venous insufficiency (chronic) (peripheral) I10 Essential (primary) hypertension Facility Procedures CPT4 Code: [id number] Description: (Facility Use Only) [phone] - APPLY MULTLAY COMPRS LWR RT LEG Modifier: Quantity: 1 Physician Procedures : CPT4 Code Description Modifier [id number] - WC PHYS LEVEL 3 - EST PT ICD-10 Diagnosis Description L97.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

## 2021-05-04 ENCOUNTER — Encounter (HOSPITAL_BASED_OUTPATIENT_CLINIC_OR_DEPARTMENT_OTHER): Payer: BLUE CROSS/BLUE SHIELD | Admitting: Internal Medicine

## 2021-05-04 ENCOUNTER — Other Ambulatory Visit: Payer: Self-pay

## 2021-05-04 DIAGNOSIS — I872 Venous insufficiency (chronic) (peripheral): Secondary | ICD-10-CM | POA: Diagnosis not present

## 2021-05-04 DIAGNOSIS — L97819 Non-pressure chronic ulcer of other part of right lower leg with unspecified severity: Secondary | ICD-10-CM | POA: Diagnosis not present

## 2021-05-04 DIAGNOSIS — F17210 Nicotine dependence, cigarettes, uncomplicated: Secondary | ICD-10-CM | POA: Diagnosis not present

## 2021-05-04 NOTE — Progress Notes (Signed)
[name], [name] ([id number]) Visit Report for 05/04/2021 Arrival Information Details Patient Name: Date of Service: [place], [name] NA LD 05/04/2021 9:00 A M Medical Record Number: [id number] Patient Account Number: [id number] Date of Birth/Sex: Treating RN: [birth year] (65 y.o. Troy Bird Primary Care Garnie Borchardt: Hillard Danker Other Clinician: Referring Keiron Iodice: Treating Garmon Dehn/Extender: Leandrew Koyanagi in Treatment: 2 Visit Information History Since Last Visit Added or deleted any medications: No Patient Arrived: Ambulatory Any new allergies or adverse reactions: No Arrival Time: 09:09 Had a fall or experienced change in No Accompanied By: self activities of daily living that may affect Transfer Assistance: None risk of falls: Patient Identification Verified: Yes Signs or symptoms of abuse/neglect since last visito No Secondary Verification Process Completed: Yes Hospitalized since last visit: No Patient Requires Transmission-Based Precautions: No Implantable device outside of the clinic excluding No Patient Has Alerts: No cellular tissue based products placed in the center since last visit: Has Dressing in Place as Prescribed: Yes Has Compression in Place as Prescribed: Yes Pain Present Now: No Electronic Signature(s) Signed: 05/04/2021 5:08:38 PM By: Zenaida Deed RN, BSN Entered By: Zenaida Deed on 05/04/2021 09:15:04 -------------------------------------------------------------------------------- Compression Therapy Details Patient Name: Date of Service: Troy Bird, [name] 05/04/2021 9:00 A M Medical Record Number: [id number] Patient Account Number: [id number] Date of Birth/Sex: Treating RN: July 03, 1956 (65 y.o. Troy Bird Primary Care Vayda Dungee: Hillard Danker Other Clinician: Referring Shaughnessy Gethers: Treating Karinne Schmader/Extender: Leandrew Koyanagi in Treatment: 2 Compression Therapy Performed for  Wound Assessment: Wound #4 Right,Medial Lower Leg Performed By: Clinician Zenaida Deed, RN Compression Type: Three Emergency planning/management officer) Signed: 05/04/2021 5:08:38 PM By: Zenaida Deed RN, BSN Entered By: Zenaida Deed on 05/04/2021 09:32:54 -------------------------------------------------------------------------------- Encounter Discharge Information Details Patient Name: Date of Service: Troy Bird, [name] 05/04/2021 9:00 A M Medical Record Number: [id number] Patient Account Number: [id number] Date of Birth/Sex: Treating RN: 31-Jul-1956 (65 y.o. Troy Bird Primary Care Troy Bird: Hillard Danker Other Clinician: Referring Troy Bird: Treating Troy Bird/Extender: Leandrew Koyanagi in Treatment: 2 Encounter Discharge Information Items Discharge Condition: Stable Ambulatory Status: Ambulatory Discharge Destination: Home Transportation: Private Auto Accompanied By: self Schedule Follow-up Appointment: Yes Clinical Summary of Care: Patient Declined Electronic Signature(s) Signed: 05/04/2021 5:08:38 PM By: Zenaida Deed RN, BSN Entered By: Zenaida Deed on 05/04/2021 09:33:36 -------------------------------------------------------------------------------- Patient/Caregiver Education Details Patient Name: Date of Service: Troy Bird, [name] 7/26/2022andnbsp9:00 A M Medical Record Number: [id number] Patient Account Number: [id number] Date of Birth/Gender: Treating RN: [birth year] (65 y.o. Troy Bird Primary Care Physician: Hillard Danker Other Clinician: Referring Physician: Treating Physician/Extender: Leandrew Koyanagi in Treatment: 2 Education Assessment Education Provided To: Patient Education Topics Provided Venous: Methods: Explain/Verbal Responses: Reinforcements needed, State content correctly Electronic Signature(s) Signed: 05/04/2021 5:08:38 PM By: Zenaida Deed RN,  BSN Entered By: Zenaida Deed on 05/04/2021 09:33:22 -------------------------------------------------------------------------------- Wound Assessment Details Patient Name: Date of Service: Troy Bird, [name] 05/04/2021 9:00 A M Medical Record Number: [id number] Patient Account Number: [id number] Date of Birth/Sex: Treating RN: Nov 22, 1955 (65 y.o. Troy Bird Primary Care Troy Bird: Hillard Danker Other Clinician: Referring Troy Bird: Treating Ani Deoliveira/Extender: Leandrew Koyanagi in Treatment: 2 Wound Status Wound Number: 4 Primary Venous Leg Ulcer Etiology: Wound Location: Right, Medial Lower Leg Wound Status: Open Wounding Event: Gradually Appeared Comorbid Deep Vein Thrombosis, Hypertension, Peripheral Venous Date Acquired: 02/07/2021 History: Disease, Gout Weeks Of Treatment: 2 Clustered Wound: No Wound Measurements Length: (cm) 0.1 Width: (cm) 0.1 Depth: (cm) 0.1  Area: (cm) 0.008 Volume: (cm) 0.001 % Reduction in Area: 99.9% % Reduction in Volume: 99.8% Epithelialization: Large (67-100%) Tunneling: No Undermining: No Wound Description Classification: Full Thickness Without Exposed Support Structures Wound Margin: Flat and Intact Exudate Amount: None Present Foul Odor After Cleansing: No Slough/Fibrino No Wound Bed Granulation Amount: None Present (0%) Exposed Structure Necrotic Amount: None Present (0%) Fascia Exposed: No Fat Layer (Subcutaneous Tissue) Exposed: No Tendon Exposed: No Muscle Exposed: No Joint Exposed: No Bone Exposed: No Limited to Skin Breakdown Treatment Notes Wound #4 (Lower Leg) Wound Laterality: Right, Medial Cleanser Soap and Water Discharge Instruction: May shower and wash wound with dial antibacterial soap and water prior to dressing change. Wound Cleanser Discharge Instruction: Cleanse the wound with wound cleanser prior to applying a clean dressing using gauze sponges, not tissue or  cotton balls. Peri-Wound Care Sween Lotion (Moisturizing lotion) Discharge Instruction: Apply moisturizing lotion as directed Topical Primary Dressing KerraCel Ag Gelling Fiber Dressing, 4x5 in (silver alginate) Discharge Instruction: Apply silver alginate to wound bed as instructed Secondary Dressing Woven Gauze Sponge, Non-Sterile 4x4 in Discharge Instruction: Apply over primary dressing as directed. ABD Pad, 8x10 Discharge Instruction: Apply over primary dressing as directed. Secured With Compression Wrap ThreePress (3 layer compression wrap) Discharge Instruction: Apply three layer compression as directed. Compression Stockings Add-Ons Electronic Signature(s) Signed: 05/04/2021 5:08:38 PM By: Zenaida Deed RN, BSN Entered By: Zenaida Deed on 05/04/2021 09:32:38 -------------------------------------------------------------------------------- Vitals Details Patient Name: Date of Service: Troy Bird, [name] 05/04/2021 9:00 A M Medical Record Number: [id number] Patient Account Number: [id number] Date of Birth/Sex: Treating RN: 1956-09-30 (65 y.o. Troy Bird Primary Care Samiyah Stupka: Hillard Danker Other Clinician: Referring Kelvis Berger: Treating [place] Fo[place]ster/Extender: Leandrew Koyanagi in Treatment: 2 Vital Signs Time Taken: 09:15 Temperature (F): 98 Height (in): 71 Pulse (bpm): 82 Source: Stated Respiratory Rate (breaths/min): 18 Weight (lbs): 178 Blood Pressure (mmHg): 162/90 Source: Stated Reference Range: 80 - 120 mg / dl Body Mass Index (BMI): 24.8 Electronic Signature(s) Signed: 05/04/2021 5:08:38 PM By: Zenaida Deed RN, BSN Entered By: Zenaida Deed on 05/04/2021 09:15:27

## 2021-05-11 ENCOUNTER — Other Ambulatory Visit: Payer: Self-pay

## 2021-05-11 ENCOUNTER — Encounter (HOSPITAL_BASED_OUTPATIENT_CLINIC_OR_DEPARTMENT_OTHER): Payer: BLUE CROSS/BLUE SHIELD | Attending: Internal Medicine | Admitting: Internal Medicine

## 2021-05-11 DIAGNOSIS — L97819 Non-pressure chronic ulcer of other part of right lower leg with unspecified severity: Secondary | ICD-10-CM

## 2021-05-11 DIAGNOSIS — Z09 Encounter for follow-up examination after completed treatment for conditions other than malignant neoplasm: Secondary | ICD-10-CM | POA: Insufficient documentation

## 2021-05-11 DIAGNOSIS — F17210 Nicotine dependence, cigarettes, uncomplicated: Secondary | ICD-10-CM | POA: Diagnosis not present

## 2021-05-11 DIAGNOSIS — Z872 Personal history of diseases of the skin and subcutaneous tissue: Secondary | ICD-10-CM | POA: Diagnosis not present

## 2021-05-11 DIAGNOSIS — I872 Venous insufficiency (chronic) (peripheral): Secondary | ICD-10-CM | POA: Insufficient documentation

## 2021-05-11 DIAGNOSIS — Z86718 Personal history of other venous thrombosis and embolism: Secondary | ICD-10-CM | POA: Diagnosis not present

## 2021-05-11 DIAGNOSIS — I10 Essential (primary) hypertension: Secondary | ICD-10-CM | POA: Insufficient documentation

## 2021-05-27 NOTE — Progress Notes (Signed)
AVIAN, KONIGSBERG ([id number]) Visit Report for 05/04/2021 SuperBill Details Patient Name: Date of Service: [place], [name] NA LD 05/04/2021 Medical Record Number: [id number] Patient Account Number: [id number] Date of Birth/Sex: Treating RN: September 01, 1956 (65 y.o. Damaris Schooner Primary Care Provider: Hillard Danker Other Clinician: Referring Provider: Treating Provider/Extender: Leandrew Koyanagi in Treatment: 2 Diagnosis Coding ICD-10 Codes Code Description [phone] Non-pressure chronic ulcer of other part of right lower leg with unspecified severity I87.2 Venous insufficiency (chronic) (peripheral) I10 Essential (primary) hypertension Facility Procedures CPT4 Code Description Modifier Quantity [phone] (Facility Use Only) [phone] - APPLY MULTLAY COMPRS LWR RT LEG 1 Electronic Signature(s) Signed: 05/04/2021 5:08:38 PM By: Zenaida Deed RN, BSN Signed: 05/27/2021 2:38:54 PM By: Geralyn Corwin DO Entered By: Zenaida Deed on 05/04/2021 09:33:49

## 2021-06-12 ENCOUNTER — Other Ambulatory Visit: Payer: Self-pay | Admitting: Internal Medicine

## 2021-06-22 NOTE — Progress Notes (Addendum)
Troy, Bird ([id number]) Visit Report for 05/11/2021 Chief Complaint Document Details Patient Name: Date of Service: Troy Bird 05/11/2021 10:15 A M Medical Record Number: [id number] Patient Account Number: [id number] Date of Birth/Sex: Treating RN: 1956/07/28 (65 y.o. Troy Bird, Lauren Primary Care Provider: Hillard Danker Other Clinician: Referring Provider: Treating Provider/Extender: Leandrew Koyanagi in Treatment: 3 Information Obtained from: Patient Chief Complaint right lower extremity wound Electronic Signature(s) Signed: 05/11/2021 11:57:21 AM By: Geralyn Corwin DO Entered By: Geralyn Corwin on 05/11/2021 11:52:44 -------------------------------------------------------------------------------- HPI Details Patient Name: Date of Service: [name], [place] NA Bird 05/11/2021 10:15 A M Medical Record Number: [id number] Patient Account Number: [id number] Date of Birth/Sex: Treating RN: [birth year] (65 y.o. Troy Bird Primary Care Provider: Hillard Danker Other Clinician: Referring Provider: Treating Provider/Extender: Leandrew Koyanagi in Treatment: 3 History of Present Illness HPI Description: Admission 7/12 Troy Bird is a 65 year old male with a past medical history of chronic venous insufficiency and hypertension that presents to the clinic for a right lower extremity wound. He has experienced a wound to this location about 3 years ago and was seen in our clinic for treatment. His current wound started 2 months ago. He has compression stockings and states he has been wearing them. He has been on his feet more often due to his job working at [employer]. He has been using Neosporin to the wound. He visited the ED for this issue and was given doxycycline for possible cellulitis. He also reports having a DVT study done that did not show a clot. He currently denies signs of infection. 7/19; patient  presents for 1 week follow-up. He reports improvement in wound healing and pain. He denies signs of infection. He had bedbugs on his clothing during the encounter. 8/2; patient presents for follow-up. He reports his wound is healed. He brought his Velcro wrap with him today. He reports no issues. Electronic Signature(s) Signed: 05/11/2021 11:57:21 AM By: Geralyn Corwin DO Entered By: Geralyn Corwin on 05/11/2021 11:53:20 -------------------------------------------------------------------------------- Physical Exam Details Patient Name: Date of Service: [name], RO NA Bird 05/11/2021 10:15 A M Medical Record Number: [id number] Patient Account Number: [id number] Date of Birth/Sex: Treating RN: 1956/05/02 (65 y.o. Troy Bird Primary Care Provider: Hillard Danker Other Clinician: Referring Provider: Treating Provider/Extender: Leandrew Koyanagi in Treatment: 3 Constitutional respirations regular, non-labored and within target range for patient.. Cardiovascular 2+ dorsalis pedis/posterior tibialis pulses. Psychiatric pleasant and cooperative. Notes Right lower extremity: No open wounds. Epithelialization to previous wound site. Excellent edema control. No signs of infection. Electronic Signature(s) Signed: 05/11/2021 11:57:21 AM By: Geralyn Corwin DO Entered By: Geralyn Corwin on 05/11/2021 11:53:53 -------------------------------------------------------------------------------- Physician Orders Details Patient Name: Date of Service: [name], RO NA Bird 05/11/2021 10:15 A M Medical Record Number: [id number] Patient Account Number: [id number] Date of Birth/Sex: Treating RN: 04-10-1956 (65 y.o. Troy Bird, Lauren Primary Care Provider: Hillard Danker Other Clinician: Referring Provider: Treating Provider/Extender: Leandrew Koyanagi in Treatment: 3 Verbal / Phone Orders: No Diagnosis Coding ICD-10 Coding Code  Description L97.819 Non-pressure chronic ulcer of other part of right lower leg with unspecified severity I87.2 Venous insufficiency (chronic) (peripheral) I10 Essential (primary) hypertension Discharge From [facility] Services Discharge from Wound Care Center Edema Control - Lymphedema / SCD / Other Elevate legs to the level of the heart or above for 30 minutes daily and/or when sitting, a frequency of: Avoid standing for long periods of time. Patient to wear own compression stockings  every day. - apply jobst in the morning and remove at bedtime Electronic Signature(s) Signed: 05/11/2021 11:57:21 AM By: Geralyn Corwin DO Entered By: Geralyn Corwin on 05/11/2021 11:54:07 -------------------------------------------------------------------------------- Problem List Details Patient Name: Date of Service: [name], RO NA Bird 05/11/2021 10:15 A M Medical Record Number: [id number] Patient Account Number: [id number] Date of Birth/Sex: Treating RN: 11/09/1955 (65 y.o. Troy Bird, Lauren Primary Care Provider: Hillard Danker Other Clinician: Referring Provider: Treating Provider/Extender: Leandrew Koyanagi in Treatment: 3 Active Problems ICD-10 Encounter Code Description Active Date MDM Diagnosis L97.819 Non-pressure chronic ulcer of other part of right lower leg with unspecified 04/20/2021 No Yes severity I87.2 Venous insufficiency (chronic) (peripheral) 04/20/2021 No Yes I10 Essential (primary) hypertension 04/20/2021 No Yes Inactive Problems Resolved Problems Electronic Signature(s) Signed: 05/11/2021 11:57:21 AM By: Geralyn Corwin DO Entered By: Geralyn Corwin on 05/11/2021 11:52:28 -------------------------------------------------------------------------------- Progress Note Details Patient Name: Date of Service: SA NDERS, RO NA Bird 05/11/2021 10:15 A M Medical Record Number: [id number] Patient Account Number: [id number] Date of Birth/Sex: Treating  RN: 09-Aug-1956 (66 y.o. Troy Bird, Lauren Primary Care Provider: Hillard Danker Other Clinician: Referring Provider: Treating Provider/Extender: Leandrew Koyanagi in Treatment: 3 Subjective Chief Complaint Information obtained from Patient right lower extremity wound History of Present Illness (HPI) Admission 7/12 [name] is a 65 year old male with a past medical history of chronic venous insufficiency and hypertension that presents to the clinic for a right lower extremity wound. He has experienced a wound to this location about 3 years ago and was seen in our clinic for treatment. His current wound started 2 months ago. He has compression stockings and states he has been wearing them. He has been on his feet more often due to his job working at [employer]. He has been using Neosporin to the wound. He visited the ED for this issue and was given doxycycline for possible cellulitis. He also reports having a DVT study done that did not show a clot. He currently denies signs of infection. 7/19; patient presents for 1 week follow-up. He reports improvement in wound healing and pain. He denies signs of infection. He had bedbugs on his clothing during the encounter. 8/2; patient presents for follow-up. He reports his wound is healed. He brought his Velcro wrap with him today. He reports no issues. Patient History Information obtained from Patient. Family History Cancer - Siblings, Diabetes - Father, Heart Disease - Father, Hypertension - Father, Thyroid Problems - Father, No family history of Hereditary Spherocytosis, Kidney Disease, Lung Disease, Seizures, Stroke, Tuberculosis. Social History Current every day smoker - half pack per day, Marital Status - Married, Alcohol Use - Rarely, Drug Use - No History, Caffeine Use - Daily - coffee. Medical History Eyes Denies history of Cataracts, Glaucoma, Optic Neuritis Ear/Nose/Mouth/Throat Denies history of  Chronic sinus problems/congestion, Middle ear problems Hematologic/Lymphatic Denies history of Anemia, Hemophilia, Lymphedema, Sickle Cell Disease Respiratory Denies history of Aspiration, Asthma, Chronic Obstructive Pulmonary Disease (COPD), Pneumothorax, Sleep Apnea, Tuberculosis Cardiovascular Patient has history of Deep Vein Thrombosis - right, Hypertension, Peripheral Venous Disease Denies history of Angina, Arrhythmia, Congestive Heart Failure, Coronary Artery Disease, Hypotension, Myocardial Infarction, Peripheral Arterial Disease, Phlebitis, Vasculitis Gastrointestinal Denies history of Cirrhosis , Colitis, Crohnoos, Hepatitis A, Hepatitis B, Hepatitis C Endocrine Denies history of Type I Diabetes, Type II Diabetes Genitourinary Denies history of End Stage Renal Disease Immunological Denies history of Lupus Erythematosus, Raynaudoos, Scleroderma Integumentary (Skin) Denies history of History of Burn Musculoskeletal Patient has history of Gout  Denies history of Rheumatoid Arthritis, Osteoarthritis, Osteomyelitis Neurologic Denies history of Dementia, Neuropathy, Quadriplegia, Paraplegia, Seizure Disorder Oncologic Denies history of Received Chemotherapy, Received Radiation Psychiatric Denies history of Anorexia/bulimia, Confinement Anxiety Hospitalization/Surgery History - [place], [place], [place]. Objective Constitutional respirations regular, non-labored and within target range for patient.. Vitals Time Taken: 10:18 AM, Height: 71 in, Weight: 178 lbs, BMI: 24.8, Temperature: 98.7 F, Pulse: 80 bpm, Respiratory Rate: 18 breaths/min, Blood Pressure: 176/90 mmHg. Cardiovascular 2+ dorsalis pedis/posterior tibialis pulses. Psychiatric pleasant and cooperative. General Notes: Right lower extremity: No open wounds. Epithelialization to previous wound site. Excellent edema control. No signs of infection. Integumentary (Hair, Skin) Wound #4 status is Healed -  Epithelialized. Original cause of wound was Gradually Appeared. The date acquired was: 02/07/2021. The wound has been in treatment 3 weeks. The wound is located on the Right,Medial Lower Leg. The wound measures 0cm length x 0cm width x 0cm depth; 0cm^2 area and 0cm^3 volume. The wound is limited to skin breakdown. There is no tunneling or undermining noted. There is a none present amount of drainage noted. The wound margin is flat and intact. There is no granulation within the wound bed. There is no necrotic tissue within the wound bed. Assessment Active Problems ICD-10 Non-pressure chronic ulcer of other part of right lower leg with unspecified severity Venous insufficiency (chronic) (peripheral) Essential (primary) hypertension Patient presents with healed wound to his right lower extremity. He did well with the compression wraps in office and silver alginate. We ordered him a Velcro wrap that he has brought with him today. We will show him how to place this in office. He is cleared to go back to work from a wound care point of view. He can Follow-up as needed. Plan Discharge From [facility] Services: Discharge from Wound Care Center Edema Control - Lymphedema / SCD / Other: Elevate legs to the level of the heart or above for 30 minutes daily and/or when sitting, a frequency of: Avoid standing for long periods of time. Patient to wear own compression stockings every day. - apply jobst in the morning and remove at bedtime 1. Discharge from our clinic due to closed wound 2. Follow-up as needed 3. Velcro compression wrap daily Electronic Signature(s) Signed: 05/11/2021 11:57:21 AM By: Geralyn Corwin DO Entered By: Geralyn Corwin on 05/11/2021 11:56:19 -------------------------------------------------------------------------------- HxROS Details Patient Name: Date of Service: SA NDERS, RO NA Bird 05/11/2021 10:15 A M Medical Record Number: [id number] Patient Account Number: [id number] Date of  Birth/Sex: Treating RN: 05/24/1956 (65 y.o. Troy Bird Primary Care Provider: Hillard Danker Other Clinician: Referring Provider: Treating Provider/Extender: Leandrew Koyanagi in Treatment: 3 Information Obtained From Patient Eyes Medical History: Negative for: Cataracts; Glaucoma; Optic Neuritis Ear/Nose/Mouth/Throat Medical History: Negative for: Chronic sinus problems/congestion; Middle ear problems Hematologic/Lymphatic Medical History: Negative for: Anemia; Hemophilia; Lymphedema; Sickle Cell Disease Respiratory Medical History: Negative for: Aspiration; Asthma; Chronic Obstructive Pulmonary Disease (COPD); Pneumothorax; Sleep Apnea; Tuberculosis Cardiovascular Medical History: Positive for: Deep Vein Thrombosis - right; Hypertension; Peripheral Venous Disease Negative for: Angina; Arrhythmia; Congestive Heart Failure; Coronary Artery Disease; Hypotension; Myocardial Infarction; Peripheral Arterial Disease; Phlebitis; Vasculitis Gastrointestinal Medical History: Negative for: Cirrhosis ; Colitis; Crohns; Hepatitis A; Hepatitis B; Hepatitis C Endocrine Medical History: Negative for: Type I Diabetes; Type II Diabetes Genitourinary Medical History: Negative for: End Stage Renal Disease Immunological Medical History: Negative for: Lupus Erythematosus; Raynauds; Scleroderma Integumentary (Skin) Medical History: Negative for: History of Burn Musculoskeletal Medical History: Positive for: Gout Negative for: Rheumatoid  Arthritis; Osteoarthritis; Osteomyelitis Neurologic Medical History: Negative for: Dementia; Neuropathy; Quadriplegia; Paraplegia; Seizure Disorder Oncologic Medical History: Negative for: Received Chemotherapy; Received Radiation Psychiatric Medical History: Negative for: Anorexia/bulimia; Confinement Anxiety Immunizations Pneumococcal Vaccine: Received Pneumococcal Vaccination: No Implantable Devices No  devices added Hospitalization / Surgery History Type of Hospitalization/Surgery [place], [place], [place] Family and Social History Cancer: Yes - Siblings; Diabetes: Yes - Father; Heart Disease: Yes - Father; Hereditary Spherocytosis: No; Hypertension: Yes - Father; Kidney Disease: No; Lung Disease: No; Seizures: No; Stroke: No; Thyroid Problems: Yes - Father; Tuberculosis: No; Current every day smoker - half pack per day; Marital Status - Married; Alcohol Use: Rarely; Drug Use: No History; Caffeine Use: Daily - coffee; Financial Concerns: No; Food, Clothing or Shelter Needs: No; Transportation Concerns: No Electronic Signature(s) Signed: 05/11/2021 11:57:21 AM By: Geralyn Corwin DO Signed: 06/22/2021 8:00:33 AM By: Fonnie Mu RN Entered By: Geralyn Corwin on 05/11/2021 11:53:28 -------------------------------------------------------------------------------- SuperBill Details Patient Name: Date of Service: [name], RO NA Bird 05/11/2021 Medical Record Number: [id number] Patient Account Number: [id number] Date of Birth/Sex: Treating RN: Dec 31, 1955 (65 y.o. Troy Bird, Lauren Primary Care Provider: Hillard Danker Other Clinician: Referring Provider: Treating Provider/Extender: Leandrew Koyanagi in Treatment: 3 Diagnosis Coding ICD-10 Codes Code Description [phone] Non-pressure chronic ulcer of other part of right lower leg with unspecified severity I87.2 Venous insufficiency (chronic) (peripheral) I10 Essential (primary) hypertension Facility Procedures CPT4 Code: [id number] Description: 99213 - WOUND CARE VISIT-LEV 3 EST PT Modifier: Quantity: 1 Physician Procedures : CPT4 Code Description Modifier [id number] - WC PHYS LEVEL 3 - EST PT ICD-10 Diagnosis Description L97.819 Non-pressure chronic ulcer of other part of right lower leg with unspecified severity I87.2 Venous insufficiency (chronic) (peripheral) I10  Essential (primary)  hypertension Quantity: 1 Electronic Signature(s) Signed: 05/11/2021 11:57:21 AM By: Geralyn Corwin DO Entered By: Geralyn Corwin on 05/11/2021 11:56:33

## 2021-07-08 NOTE — Progress Notes (Signed)
Troy Bird, Troy Bird ([id number]) Visit Report for 05/11/2021 Arrival Information Details Patient Name: Date of Service: Troy Bird 05/11/2021 10:15 A M Medical Record Number: [id number] Patient Account Number: [id number] Date of Birth/Sex: Treating RN: June 22, 1956 (65 y.o. Troy Bird Primary Care Christella App: Troy Bird Other Clinician: Referring Troy Bird: Treating Troy Bird/Extender: Troy Bird in Treatment: 3 Visit Information History Since Last Visit Added or deleted any medications: No Patient Arrived: Ambulatory Any new allergies or adverse reactions: No Arrival Time: 10:18 Had a fall or experienced change in No Accompanied By: self activities of daily living that may affect Transfer Assistance: None risk of falls: Patient Identification Verified: Yes Signs or symptoms of abuse/neglect since last visito No Secondary Verification Process Completed: Yes Hospitalized since last visit: No Patient Requires Transmission-Based Precautions: No Implantable device outside of the clinic excluding No Patient Has Alerts: No cellular tissue based products placed in the center since last visit: Has Dressing in Place as Prescribed: Yes Pain Present Now: No Electronic Signature(s) Signed: 05/25/2021 11:33:27 AM By: Troy Bird Entered By: Troy Bird on 05/11/2021 10:18:44 -------------------------------------------------------------------------------- Clinic Level of Care Assessment Details Patient Name: Date of Service: Troy Bird 05/11/2021 10:15 A M Medical Record Number: [id number] Patient Account Number: [id number] Date of Birth/Sex: Treating RN: 03-19-1956 (65 y.o. Troy Bird Primary Care Troy Bird: Troy Bird Other Clinician: Referring Troy Bird: Treating Troy Bird/Extender: Troy Bird in Treatment: 3 Clinic Level of Care Assessment Items TOOL 4 Quantity Score X- 1  0 Use when only an EandM is performed on FOLLOW-UP visit ASSESSMENTS - Nursing Assessment / Reassessment []  - 0 Reassessment of Co-morbidities (includes updates in patient status) []  - 0 Reassessment of Adherence to Treatment Plan ASSESSMENTS - Wound and Skin A ssessment / Reassessment X - Simple Wound Assessment / Reassessment - one wound 1 5 []  - 0 Complex Wound Assessment / Reassessment - multiple wounds X- 1 10 Dermatologic / Skin Assessment (not related to wound area) ASSESSMENTS - Focused Assessment []  - 0 Circumferential Edema Measurements - multi extremities []  - 0 Nutritional Assessment / Counseling / Intervention []  - 0 Lower Extremity Assessment (monofilament, tuning fork, pulses) []  - 0 Peripheral Arterial Disease Assessment (using hand held doppler) ASSESSMENTS - Ostomy and/or Continence Assessment and Care []  - 0 Incontinence Assessment and Management []  - 0 Ostomy Care Assessment and Management (repouching, etc.) PROCESS - Coordination of Care X - Simple Patient / Family Education for ongoing care 1 15 []  - 0 Complex (extensive) Patient / Family Education for ongoing care X- 1 10 Staff obtains Consents, Records, T Results / Process Orders est X- 1 10 Staff telephones HHA, Nursing Homes / Clarify orders / etc []  - 0 Routine Transfer to another Facility (non-emergent condition) []  - 0 Routine Hospital Admission (non-emergent condition) []  - 0 New Admissions / / Ordering NPWT Apligraf, etc. , []  - 0 Emergency Hospital Admission (emergent condition) X- 1 10 Simple Discharge Coordination []  - 0 Complex (extensive) Discharge Coordination PROCESS - Special Needs []  - 0 Pediatric / Minor Patient Management []  - 0 Isolation Patient Management []  - 0 Hearing / Language / Visual special needs []  - 0 Assessment of Community assistance (transportation, D/C planning, etc.) []  - 0 Additional assistance / Altered mentation []  -  0 Support Surface(s) Assessment (bed, cushion, seat, etc.) INTERVENTIONS - Wound Cleansing / Measurement X - Simple Wound Cleansing - one wound 1 5 []  - 0 Complex Wound Cleansing - multiple  wounds X- 1 5 Wound Imaging (photographs - any number of wounds) []  - 0 Wound Tracing (instead of photographs) X- 1 5 Simple Wound Measurement - one wound []  - 0 Complex Wound Measurement - multiple wounds INTERVENTIONS - Wound Dressings X - Small Wound Dressing one or multiple wounds 1 10 []  - 0 Medium Wound Dressing one or multiple wounds []  - 0 Large Wound Dressing one or multiple wounds []  - 0 Application of Medications - topical []  - 0 Application of Medications - injection INTERVENTIONS - Miscellaneous []  - 0 External ear exam []  - 0 Specimen Collection (cultures, biopsies, blood, body fluids, etc.) []  - 0 Specimen(s) / Culture(s) sent or taken to Lab for analysis []  - 0 Patient Transfer (multiple staff / / Similar devices) []  - 0 Simple Staple / Suture removal (25 or less) []  - 0 Complex Staple / Suture removal (26 or more) []  - 0 Hypo / Hyperglycemic Management (close monitor of Blood Glucose) []  - 0 Ankle / Brachial Index (ABI) - do not check if billed separately X- 1 5 Vital Signs Has the patient been seen at the hospital within the last three years: Yes Total Score: 90 Level Of Care: New/Established - Level 3 Electronic Signature(s) Signed: 06/22/2021 8:00:33 AM By: RN Entered By: on 05/11/2021 11:03:48 -------------------------------------------------------------------------------- Encounter Discharge Information Details Patient Name: Date of Service: Troy Bird 05/11/2021 10:15 A M Medical Record Number: Patient Account Number: Date of Birth/Sex: Treating RN: [birth year] (65 y.o. Primary Care Troy Bird: Other Clinician: Referring Troy Bird: Treating  Troy Bird/Extender: in Treatment: 3 Encounter Discharge Information Items Discharge Condition: Stable Ambulatory Status: Ambulatory Discharge Destination: Home Transportation: Private Auto Schedule Follow-up Appointment: No Clinical Summary of Care: Provided on 05/11/2021 Form Type Recipient Paper Patient Patient Electronic Signature(s) Signed: 05/11/2021 11:33:38 AM By: Fonnie Mu Entered By: Fonnie Mu on 05/11/2021 11:33:38 -------------------------------------------------------------------------------- Lower Extremity Assessment Details Patient Name: Date of Service: Troy NDERS, RO NA Bird 05/11/2021 10:15 A M Medical Record Number: 07/11/2021 Patient Account Number: [id number] Date of Birth/Sex: Treating RN: 12-18-1955 (65 y.o. 77 Primary Care Tarvaris Puglia: Lytle Michaels Other Clinician: Referring Legna Mausolf: Treating Gladstone Rosas/Extender: Troy Bird in Treatment: 3 Edema Assessment Assessed: [Left: No] [Right: No] Edema: [Left: Ye] [Right: s] Calf Left: Right: Point of Measurement: 39 cm From Medial Instep 34 cm Ankle Left: Right: Point of Measurement: 10 cm From Medial Instep 22 cm Vascular Assessment Pulses: Dorsalis Pedis Palpable: [Right:Yes] Electronic Signature(s) Signed: 05/14/2021 12:35:09 PM By: 07/11/2021 RN, BSN Entered By: 07/11/2021 on 05/11/2021 10:33:15 -------------------------------------------------------------------------------- Multi Wound Chart Details Patient Name: Date of Service: Troy Antonieta Iba, RO NA Bird 05/11/2021 10:15 A M Medical Record Number: 07/11/2021 Patient Account Number: [id number] Date of Birth/Sex: Treating RN: [birth year] (65 y.o. 77, Bird Primary Care Bobbyjo Marulanda: [name] Other Clinician: Referring Athalene Kolle: Treating Cyann Venti/Extender: Troy Bird in Treatment: 3 Vital Signs Height(in):  71 Pulse(bpm): 80 Weight(lbs): 178 Blood Pressure(mmHg): 176/90 Body Mass Index(BMI): 25 Temperature(F): 98.7 Respiratory Rate(breaths/min): 18 Photos: [N/A:N/A] Right, Medial Lower Leg N/A N/A Wound Location: Gradually Appeared N/A N/A Wounding Event: Venous Leg Ulcer N/A N/A Primary Etiology: Deep Vein Thrombosis, Hypertension, N/A N/A Comorbid History: Peripheral Venous Disease, Gout 02/07/2021 N/A N/A Date Acquired: 3 N/A N/A Weeks of Treatment: Healed - Epithelialized N/A N/A Wound Status: 0x0x0 N/A N/A Measurements L x W x D (cm) 0 N/A  N/A A (cm) : rea 0 N/A N/A Volume (cm) : 100.00% N/A N/A % Reduction in Area: 100.00% N/A N/A % Reduction in Volume: Full Thickness Without Exposed N/A N/A Classification: Support Structures None Present N/A N/A Exudate Amount: Flat and Intact N/A N/A Wound Margin: None Present (0%) N/A N/A Granulation Amount: None Present (0%) N/A N/A Necrotic Amount: Fascia: No N/A N/A Exposed Structures: Fat Layer (Subcutaneous Tissue): No Tendon: No Muscle: No Joint: No Bone: No Limited to Skin Breakdown Large (67-100%) N/A N/A Epithelialization: Treatment Notes Electronic Signature(s) Signed: 05/11/2021 11:57:21 AM By: Geralyn Corwin DO Signed: 06/22/2021 8:00:33 AM By: Fonnie Mu RN Entered By: Geralyn Corwin on 05/11/2021 11:52:35 -------------------------------------------------------------------------------- Multi-Disciplinary Care Plan Details Patient Name: Date of Service: [facility] [place], RO NA Bird 05/11/2021 10:15 A M Medical Record Number: [id number] Patient Account Number: [id number] Date of Birth/Sex: Treating RN: 08/07/1956 (65 y.o. Troy Bird Primary Care Kindsey Eblin: Troy Bird Other Clinician: Referring Brees Hounshell: Treating Jontae Adebayo/Extender: Troy Bird in Treatment: 3 Multidisciplinary Care Plan reviewed with physician Active Inactive Electronic  Signature(s) Signed: 06/22/2021 8:00:33 AM By: Fonnie Mu RN Signed: 07/08/2021 6:02:28 PM By: Zenaida Deed RN, BSN Entered By: Zenaida Deed on 06/16/2021 08:02:23 -------------------------------------------------------------------------------- Pain Assessment Details Patient Name: Date of Service: Troy Pecolia Ades, RO NA Bird 05/11/2021 10:15 A M Medical Record Number: [id number] Patient Account Number: [id number] Date of Birth/Sex: Treating RN: 18-Mar-1956 (65 y.o. Troy Bird Primary Care Hisashi Amadon: Troy Bird Other Clinician: Referring Deone Omahoney: Treating Ayza Ripoll/Extender: Troy Bird in Treatment: 3 Active Problems Location of Pain Severity and Description of Pain Patient Has Paino No Site Locations Pain Management and Medication Current Pain Management: Electronic Signature(s) Signed: 05/25/2021 11:33:27 AM By: Troy Bird Signed: 06/22/2021 8:00:33 AM By: Fonnie Mu RN Entered By: Troy Bird on 05/11/2021 10:19:11 -------------------------------------------------------------------------------- Patient/Caregiver Education Details Patient Name: Date of Service: Troy Schanz, RO NA Bird 8/2/2022andnbsp10:15 A M Medical Record Number: [id number] Patient Account Number: [id number] Date of Birth/Gender: Treating RN: Aug 06, 1956 (65 y.o. Lucious Groves Primary Care Physician: Troy Bird Other Clinician: Referring Physician: Treating Physician/Extender: Troy Bird in Treatment: 3 Education Assessment Education Provided To: Patient Education Topics Provided Venous: Methods: Explain/Verbal Responses: State content correctly Wound/Skin Impairment: Methods: Explain/Verbal Responses: State content correctly Electronic Signature(s) Signed: 06/22/2021 8:00:33 AM By: Fonnie Mu RN Entered By: Fonnie Mu on 05/11/2021  11:03:13 -------------------------------------------------------------------------------- Wound Assessment Details Patient Name: Date of Service: Troy Pecolia Ades, RO NA Bird 05/11/2021 10:15 A M Medical Record Number: [id number] Patient Account Number: [id number] Date of Birth/Sex: Treating RN: Oct 03, 1956 (65 y.o. Troy Bird Primary Care Camey Edell: Troy Bird Other Clinician: Referring Concepcion Kirkpatrick: Treating Seraphina Mitchner/Extender: Troy Bird in Treatment: 3 Wound Status Wound Number: 4 Primary Venous Leg Ulcer Etiology: Wound Location: Right, Medial Lower Leg Wound Status: Healed - Epithelialized Wounding Event: Gradually Appeared Comorbid Deep Vein Thrombosis, Hypertension, Peripheral Venous Date Acquired: 02/07/2021 History: Disease, Gout Weeks Of Treatment: 3 Clustered Wound: No Photos Wound Measurements Length: (cm) Width: (cm) Depth: (cm) Area: (cm) Volume: (cm) 0 % Reduction in Area: 100% 0 % Reduction in Volume: 100% 0 Epithelialization: Large (67-100%) 0 Tunneling: No 0 Undermining: No Wound Description Classification: Full Thickness Without Exposed Support Structures Wound Margin: Flat and Intact Exudate Amount: None Present Foul Odor After Cleansing: No Slough/Fibrino No Wound Bed Granulation Amount: None Present (0%) Exposed Structure Necrotic Amount: None Present (0%) Fascia Exposed: No Fat Layer (Subcutaneous Tissue) Exposed: No Tendon Exposed: No Muscle Exposed: No Joint Exposed:  No Bone Exposed: No Limited to Skin Breakdown Electronic Signature(s) Signed: 05/14/2021 12:35:09 PM By: Zandra Abts RN, BSN Signed: 06/22/2021 8:00:33 AM By: Fonnie Mu RN Entered By: Zandra Abts on 05/11/2021 10:33:30 -------------------------------------------------------------------------------- Vitals Details Patient Name: Date of Service: Troy Pecolia Ades, RO NA Bird 05/11/2021 10:15 A M Medical Record Number: [id number] Patient  Account Number: [id number] Date of Birth/Sex: Treating RN: 1955/12/23 (65 y.o. Troy Bird Primary Care Lera Gaines: Troy Bird Other Clinician: Referring Kamiya Acord: Treating Amara Manalang/Extender: Troy Bird in Treatment: 3 Vital Signs Time Taken: 10:18 Temperature (F): 98.7 Height (in): 71 Pulse (bpm): 80 Weight (lbs): 178 Respiratory Rate (breaths/min): 18 Body Mass Index (BMI): 24.8 Blood Pressure (mmHg): 176/90 Reference Range: 80 - 120 mg / dl Electronic Signature(s) Signed: 05/25/2021 11:33:27 AM By: Troy Bird Entered By: Troy Bird on 05/11/2021 10:19:03

## 2022-06-06 ENCOUNTER — Ambulatory Visit (INDEPENDENT_AMBULATORY_CARE_PROVIDER_SITE_OTHER): Payer: Self-pay | Admitting: Family Medicine

## 2022-06-06 ENCOUNTER — Encounter: Payer: Self-pay | Admitting: Family Medicine

## 2022-06-06 VITALS — BP 168/110 | HR 86 | Temp 98.6°F | Ht 71.0 in | Wt 160.0 lb

## 2022-06-06 DIAGNOSIS — M545 Low back pain, unspecified: Secondary | ICD-10-CM

## 2022-06-06 DIAGNOSIS — R319 Hematuria, unspecified: Secondary | ICD-10-CM

## 2022-06-06 DIAGNOSIS — M47816 Spondylosis without myelopathy or radiculopathy, lumbar region: Secondary | ICD-10-CM | POA: Insufficient documentation

## 2022-06-06 LAB — POC URINALSYSI DIPSTICK (AUTOMATED)
Bilirubin, UA: NEGATIVE
Blood, UA: POSITIVE
Glucose, UA: NEGATIVE
Ketones, UA: NEGATIVE
Leukocytes, UA: NEGATIVE
Nitrite, UA: NEGATIVE
Protein, UA: POSITIVE — AB
Spec Grav, UA: 1.015 (ref 1.010–1.025)
Urobilinogen, UA: 0.2 E.U./dL
pH, UA: 6 (ref 5.0–8.0)

## 2022-06-06 MED ORDER — TRAMADOL HCL 50 MG PO TABS: 50.0000 mg | ORAL_TABLET | Freq: Three times a day (TID) | ORAL | 0 refills | Status: AC | PRN

## 2022-06-06 MED ORDER — METHOCARBAMOL 500 MG PO TABS: 500.0000 mg | ORAL_TABLET | Freq: Three times a day (TID) | ORAL | 0 refills | Status: DC | PRN

## 2022-06-06 NOTE — Progress Notes (Signed)
Subjective:     Patient ID: Troy Bird, male    DOB: February 03, 1956, 66 y.o.   MRN: [id number]  Chief Complaint  Patient presents with   Back Pain    HPI Patient is in today for bilateral low back pain that radiates around to his abdomen at times. Denies abdominal pain, N/V/D, urinary symptoms. No fever, chills. Denies numbness, tingling or weakness of LEs.   Hx of low back pain. Requests refill of pain medications. States PT did not really help him.   Smoker    Health Maintenance Due  Topic Date Due   HIV Screening  Never done   Hepatitis C Screening  Never done   TETANUS/TDAP  Never done   COLONOSCOPY (Pts 45-25yrs Insurance coverage will need to be confirmed)  Never done   Zoster Vaccines- Shingrix (1 of 2) Never done   COVID-19 Vaccine (3 - Pfizer series) 07/09/2020   Pneumonia Vaccine 11+ Years old (1 - PCV) Never done   INFLUENZA VACCINE  05/10/2022    Past Medical History:  Diagnosis Date   Alcohol abuse    Colon polyps    Hx of blood clots    Hyperlipidemia    Hypertension    Phlebitis     Past Surgical History:  Procedure Laterality Date   LEG SURGERY Right 2002   TRACHEAL ESOPHAGEAL PUNCTURE REPAIR  2002    Family History  Problem Relation Age of Onset   Arthritis Mother    Hypertension Mother    Alcohol abuse Father    Hypertension Father    Diabetes Father    Heart disease Father     Social History   Socioeconomic History   Marital status: Married    Spouse name: Not on file   Number of children: Not on file   Years of education: Not on file   Highest education level: Not on file  Occupational History   Not on file  Tobacco Use   Smoking status: Former    Packs/day: 0.25    Years: 57.00    Total pack years: 14.25    Types: Cigarettes   Smokeless tobacco: Never  Vaping Use   Vaping Use: Never used  Substance and Sexual Activity   Alcohol use: No    Alcohol/week: 0.0 standard drinks of alcohol   Drug use: No   Sexual activity:  Not on file  Other Topics Concern   Not on file  Social History Narrative   Not on file   Social Determinants of Health   Financial Resource Strain: Not on file  Food Insecurity: Not on file  Transportation Needs: Not on file  Physical Activity: Not on file  Stress: Not on file  Social Connections: Not on file  Intimate Partner Violence: Not on file    Outpatient Medications Prior to Visit  Medication Sig Dispense Refill   amLODipine (NORVASC) 10 MG tablet Take 1 tablet (10 mg total) by mouth daily. 90 tablet 0   calcium-vitamin D (OSCAL WITH D) 500-200 MG-UNIT tablet Take 1 tablet by mouth 2 (two) times daily. 180 tablet 3   famotidine (PEPCID) 40 MG tablet TAKE 1 TABLET (40 MG TOTAL) BY MOUTH DAILY. FOR YOUR RASH/ITCHING 30 tablet 5   levocetirizine (XYZAL) 5 MG tablet Take 1 tablet (5 mg total) by mouth every evening. For your rash/itching. 30 tablet 5   losartan (COZAAR) 50 MG tablet Take 2 tablets (100 mg total) by mouth daily. 90 tablet 1   pentoxifylline (TRENTAL)  400 MG CR tablet Take 1 tablet (400 mg total) by mouth 3 (three) times daily with meals. 90 tablet 3   pravastatin (PRAVACHOL) 40 MG tablet Take 1 tablet (40 mg total) by mouth daily. 90 tablet 1   gabapentin (NEURONTIN) 300 MG capsule Take 300-600 mg by mouth at bedtime.     methocarbamol (ROBAXIN) 500 MG tablet Take 1 tablet (500 mg total) by mouth every 8 (eight) hours as needed for muscle spasms. 30 tablet 0   oxyCODONE-acetaminophen (PERCOCET) 5-325 MG tablet Take 1 tablet by mouth every 6 (six) hours as needed. 10 tablet 0   doxycycline (VIBRAMYCIN) 100 MG capsule Take 1 capsule (100 mg total) by mouth 2 (two) times daily. One po bid x 7 days 14 capsule 0   No facility-administered medications prior to visit.    Allergies  Allergen Reactions   Lyrica [Pregabalin] Swelling    And altered mental status    ROS     Objective:    Physical Exam Constitutional:      General: He is not in acute  distress.    Appearance: He is not ill-appearing.  Eyes:     Conjunctiva/sclera: Conjunctivae normal.  Cardiovascular:     Rate and Rhythm: Normal rate.  Pulmonary:     Effort: Pulmonary effort is normal.     Breath sounds: Normal breath sounds.  Abdominal:     Palpations: Abdomen is soft.     Tenderness: There is no abdominal tenderness. There is no right CVA tenderness, left CVA tenderness, guarding or rebound.  Musculoskeletal:     Cervical back: Normal range of motion and neck supple.     Thoracic back: Normal.     Lumbar back: Tenderness present. Decreased range of motion. Negative right straight leg raise test and negative left straight leg raise test.     Comments: Limited ROM of lumbar spine due to pain.   Skin:    General: Skin is dry.  Neurological:     General: No focal deficit present.     Mental Status: He is alert.     Sensory: No sensory deficit.     Motor: No weakness.     Gait: Gait normal.     BP (!) 168/110 (BP Location: Right Arm, Patient Position: Sitting)   Pulse 86   Temp 98.6 F (37 C) (Oral)   Ht 5\' 11"  (1.803 m)   Wt 160 lb (72.6 kg)   SpO2 97%   BMI 22.32 kg/m  Wt Readings from Last 3 Encounters:  06/06/22 160 lb (72.6 kg)  04/09/21 178 lb (80.7 kg)  03/17/21 169 lb (76.7 kg)       Assessment & Plan:   Problem List Items Addressed This Visit       Musculoskeletal and Integument   Osteoarthritis of lumbar spine    Reviewed previous visit regarding low back pain and X ray results showing osteoarthritic changes to L4-L5 in June 2022. He was referred to PT at that time.       Relevant Medications   traMADol (ULTRAM) 50 MG tablet   methocarbamol (ROBAXIN) 500 MG tablet   Other Relevant Orders   Ambulatory referral to Orthopedic Surgery     Other   Hematuria    UA negative for infection, 1+RBCs. Sent for culture, asymptomatic. Back pain unlikely renal stone.       Relevant Orders   Urine Culture   Low back pain - Primary     No  red  flag symptoms. Back pain which appears to be related to MSK etiology. Declines NSAIDs. Refilled Robaxin per request. Tramadol prescribed and will not take at same time as Robaxin. Referral to orthopedist for further evaluation.       Relevant Medications   traMADol (ULTRAM) 50 MG tablet   methocarbamol (ROBAXIN) 500 MG tablet   Other Relevant Orders   POCT Urinalysis Dipstick (Automated) (Completed)   Ambulatory referral to Orthopedic Surgery    I have discontinued Windy Fast Topper's gabapentin, oxyCODONE-acetaminophen, and doxycycline. I am also having him start on traMADol. Additionally, I am having him maintain his pentoxifylline, calcium-vitamin D, pravastatin, losartan, famotidine, levocetirizine, amLODipine, and methocarbamol.  Meds ordered this encounter  Medications   traMADol (ULTRAM) 50 MG tablet    Sig: Take 1 tablet (50 mg total) by mouth every 8 (eight) hours as needed for up to 3 days.    Dispense:  9 tablet    Refill:  0    Order Specific Question:   Supervising Provider    Answer:   Hillard Danker A [[id number]]   methocarbamol (ROBAXIN) 500 MG tablet    Sig: Take 1 tablet (500 mg total) by mouth every 8 (eight) hours as needed for muscle spasms.    Dispense:  30 tablet    Refill:  0    Order Specific Question:   Supervising Provider    Answer:   Hillard Danker A [[id number]]

## 2022-06-06 NOTE — Patient Instructions (Addendum)
I refilled your muscle relaxant, methocarbamol, to take as needed for severe pain.  Please be aware this medication is sedating.  I also prescribed tramadol for pain.  This medication may also be sedating and you should not take this with the methocarbamol.  I referred you to [facility] and they will call you to schedule a visit for your back pain.  You may also use a heating pad or an ice pack to the area that is hurting.  You may also use an over-the-counter topical  pain medicine such as Salonpas with lidocaine, lidocaine patches or Biofreeze.

## 2022-06-06 NOTE — Assessment & Plan Note (Signed)
UA negative for infection, 1+RBCs. Sent for culture, asymptomatic. Back pain unlikely renal stone.

## 2022-06-06 NOTE — Assessment & Plan Note (Addendum)
No red flag symptoms. Back pain which appears to be related to MSK etiology. Declines NSAIDs. Refilled Robaxin per request. Tramadol prescribed and will not take at same time as Robaxin. Referral to orthopedist for further evaluation.

## 2022-06-06 NOTE — Assessment & Plan Note (Signed)
Reviewed previous visit regarding low back pain and X ray results showing osteoarthritic changes to L4-L5 in June 2022. He was referred to PT at that time.

## 2022-06-07 LAB — URINE CULTURE: Result:: NO GROWTH

## 2022-06-08 NOTE — Progress Notes (Signed)
His urine culture did not grow bacteria so he does not have a urinary tract infection.

## 2023-06-26 ENCOUNTER — Encounter: Payer: Self-pay | Admitting: Internal Medicine

## 2023-06-26 ENCOUNTER — Ambulatory Visit (INDEPENDENT_AMBULATORY_CARE_PROVIDER_SITE_OTHER): Payer: Self-pay | Admitting: Internal Medicine

## 2023-06-26 ENCOUNTER — Ambulatory Visit: Payer: Self-pay | Admitting: Internal Medicine

## 2023-06-26 VITALS — BP 190/110 | HR 89 | Temp 98.0°F | Ht 71.0 in | Wt 156.0 lb

## 2023-06-26 DIAGNOSIS — Z23 Encounter for immunization: Secondary | ICD-10-CM

## 2023-06-26 DIAGNOSIS — I10 Essential (primary) hypertension: Secondary | ICD-10-CM

## 2023-06-26 DIAGNOSIS — M5442 Lumbago with sciatica, left side: Secondary | ICD-10-CM

## 2023-06-26 MED ORDER — AMLODIPINE BESYLATE 10 MG PO TABS: 10.0000 mg | ORAL_TABLET | Freq: Every day | ORAL | 0 refills | Status: DC

## 2023-06-26 MED ORDER — METHOCARBAMOL 500 MG PO TABS: 500.0000 mg | ORAL_TABLET | Freq: Three times a day (TID) | ORAL | 0 refills | Status: AC | PRN

## 2023-06-26 MED ORDER — LOSARTAN POTASSIUM 100 MG PO TABS: 100.0000 mg | ORAL_TABLET | Freq: Every day | ORAL | 0 refills | Status: DC

## 2023-06-26 NOTE — Patient Instructions (Addendum)
       Medications changes include :   methocarbamol - muscle relaxer.  Restart blood pressure medications-sent to pharmacy   Try using ice or heat.     A referral was ordered orthopedics and someone will call you to schedule an appointment.  You can call them to schedule an appointment.     Troy Bird Address: [address], [place], [place] [address] Phone: [phone]    Return if symptoms worsen or fail to improve.

## 2023-06-26 NOTE — Addendum Note (Signed)
Addended by: Karma Ganja on: 06/26/2023 04:52 PM   Modules accepted: Orders

## 2023-06-26 NOTE — Progress Notes (Signed)
Subjective:    Patient ID: Troy Bird, male    DOB: [birth year], 67 y.o.   MRN: [id number]      HPI Troy Bird is here for  Chief Complaint  Patient presents with   Back Pain    Back and right leg pain     Bilateral lower back pain and pain down the right leg.  The pain started a while ago  He thinks he lifted something heavy at work and that may have caused it.  This has been a recurring problem for him and has been seen several times for this.  He has been referred to physical therapy and has not done it.  His last visit he was referred to orthopedics and although they attempted to call him as documented in the referral he states he never received a call.  He is very hard of hearing which could be the explanation.   No N/T.  No other symptoms  Medications and allergies reviewed with patient and updated if appropriate.  Current Outpatient Medications on File Prior to Visit  Medication Sig Dispense Refill   amLODipine (NORVASC) 10 MG tablet Take 1 tablet (10 mg total) by mouth daily. 90 tablet 0   calcium-vitamin D (OSCAL WITH D) 500-200 MG-UNIT tablet Take 1 tablet by mouth 2 (two) times daily. 180 tablet 3   famotidine (PEPCID) 40 MG tablet TAKE 1 TABLET (40 MG TOTAL) BY MOUTH DAILY. FOR YOUR RASH/ITCHING 30 tablet 5   levocetirizine (XYZAL) 5 MG tablet Take 1 tablet (5 mg total) by mouth every evening. For your rash/itching. 30 tablet 5   losartan (COZAAR) 50 MG tablet Take 2 tablets (100 mg total) by mouth daily. 90 tablet 1   methocarbamol (ROBAXIN) 500 MG tablet Take 1 tablet (500 mg total) by mouth every 8 (eight) hours as needed for muscle spasms. 30 tablet 0   pentoxifylline (TRENTAL) 400 MG CR tablet Take 1 tablet (400 mg total) by mouth 3 (three) times daily with meals. 90 tablet 3   pravastatin (PRAVACHOL) 40 MG tablet Take 1 tablet (40 mg total) by mouth daily. 90 tablet 1   No current facility-administered medications on file prior to visit.    Review of Systems   Constitutional:  Negative for fever.  Cardiovascular:  Negative for chest pain, palpitations and leg swelling.  Genitourinary:        No change in urination  Musculoskeletal:  Positive for back pain.  Neurological:  Positive for weakness (Mild weakness left leg secondary to pain). Negative for numbness.       Objective:   Vitals:   06/26/23 1558 06/26/23 1602  BP: (!) 180/100 (!) 190/110  Pulse: 89   Temp: 98 F (36.7 C)   SpO2: 98%    BP Readings from Last 3 Encounters:  06/26/23 (!) 190/110  06/06/22 (!) 168/110  04/10/21 (!) 147/98   Wt Readings from Last 3 Encounters:  06/26/23 156 lb (70.8 kg)  06/06/22 160 lb (72.6 kg)  04/09/21 178 lb (80.7 kg)   Body mass index is 21.76 kg/m.    Physical Exam         Assessment & Plan:    Lower back pain with left-sided radiculopathy: Acute on chronic Has had recurrent episodes of the back pain Not able to tell me exactly when the pain started-possible continuation since he was seen last August Has been referred to orthopedics in the past, but denies ever being called Declined referral for physical therapy Start  methocarbamol 500 mg every 8 hours as needed Continue ice, heat Stressed that he needs to see orthopedics for further evaluation of his back pain since it is recurrent-referral ordered again for Ortho care-he is very hard of hearing which may be why he did not hear them call previously-phone number given and advised that he needs to call them to schedule an appointment  Hypertension: Chronic Poorly controlled He is not taking his medication-he is a poor historian Restart amlodipine 10 mg daily and losartan 100 mg daily

## 2023-07-06 ENCOUNTER — Encounter: Payer: Self-pay | Admitting: Physical Medicine and Rehabilitation

## 2023-07-06 ENCOUNTER — Ambulatory Visit (INDEPENDENT_AMBULATORY_CARE_PROVIDER_SITE_OTHER): Payer: Self-pay | Admitting: Physical Medicine and Rehabilitation

## 2023-07-06 DIAGNOSIS — M5416 Radiculopathy, lumbar region: Secondary | ICD-10-CM

## 2023-07-06 DIAGNOSIS — M47816 Spondylosis without myelopathy or radiculopathy, lumbar region: Secondary | ICD-10-CM

## 2023-07-06 DIAGNOSIS — M5442 Lumbago with sciatica, left side: Secondary | ICD-10-CM

## 2023-07-06 DIAGNOSIS — G8929 Other chronic pain: Secondary | ICD-10-CM
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
## 2023-07-06 NOTE — Progress Notes (Signed)
Functional Pain Scale - descriptive words and definitions  Unmanageable (7)  Pain interferes with normal ADL's/nothing seems to help/sleep is very difficult/active distractions are very difficult to concentrate on. Severe range order  Average Pain 9-10  Lower back pain on both sides with radiation in the left groin area

## 2024-04-03 ENCOUNTER — Ambulatory Visit: Payer: Self-pay

## 2024-04-03 ENCOUNTER — Telehealth: Payer: Self-pay | Admitting: Internal Medicine

## 2024-04-03 ENCOUNTER — Other Ambulatory Visit: Payer: Self-pay

## 2024-04-03 ENCOUNTER — Emergency Department (HOSPITAL_COMMUNITY)
Admission: EM | Admit: 2024-04-03 | Discharge: 2024-04-03 | Disposition: A | Discharge status: Home / Self Care | Attending: Emergency Medicine | Admitting: Emergency Medicine

## 2024-04-03 ENCOUNTER — Emergency Department (HOSPITAL_COMMUNITY)

## 2024-04-03 ENCOUNTER — Encounter (HOSPITAL_COMMUNITY): Payer: Self-pay

## 2024-04-03 DIAGNOSIS — I10 Essential (primary) hypertension: Secondary | ICD-10-CM | POA: Insufficient documentation

## 2024-04-03 DIAGNOSIS — Z79899 Other long term (current) drug therapy: Secondary | ICD-10-CM | POA: Diagnosis not present

## 2024-04-03 DIAGNOSIS — I160 Hypertensive urgency: Secondary | ICD-10-CM | POA: Insufficient documentation

## 2024-04-03 DIAGNOSIS — R410 Disorientation, unspecified: Secondary | ICD-10-CM | POA: Diagnosis not present

## 2024-04-03 DIAGNOSIS — R42 Dizziness and giddiness: Secondary | ICD-10-CM | POA: Diagnosis present

## 2024-04-03 DIAGNOSIS — E876 Hypokalemia: Secondary | ICD-10-CM | POA: Diagnosis not present

## 2024-04-03 LAB — COMPREHENSIVE METABOLIC PANEL WITH GFR
ALT: 9 U/L (ref 0–44)
AST: 19 U/L (ref 15–41)
Albumin: 3.7 g/dL (ref 3.5–5.0)
Alkaline Phosphatase: 103 U/L (ref 38–126)
Anion gap: 11 (ref 5–15)
BUN: 18 mg/dL (ref 8–23)
CO2: 27 mmol/L (ref 22–32)
Calcium: 6.1 mg/dL — CL (ref 8.9–10.3)
Chloride: 101 mmol/L (ref 98–111)
Creatinine, Ser: 1.56 mg/dL — ABNORMAL HIGH (ref 0.61–1.24)
GFR, Estimated: 48 mL/min — ABNORMAL LOW (ref >60)
Glucose, Bld: 91 mg/dL (ref 70–99)
Potassium: 3.1 mmol/L — ABNORMAL LOW (ref 3.5–5.1)
Sodium: 139 mmol/L (ref 135–145)
Total Bilirubin: 0.9 mg/dL (ref 0.0–1.2)
Total Protein: 8.6 g/dL — ABNORMAL HIGH (ref 6.5–8.1)

## 2024-04-03 LAB — MAGNESIUM: Magnesium: 2 mg/dL (ref 1.7–2.4)

## 2024-04-03 LAB — URINALYSIS, ROUTINE W REFLEX MICROSCOPIC
Bacteria, UA: NONE SEEN
Bilirubin Urine: NEGATIVE
Glucose, UA: NEGATIVE mg/dL
Ketones, ur: NEGATIVE mg/dL
Leukocytes,Ua: NEGATIVE
Nitrite: NEGATIVE
Protein, ur: 100 mg/dL — AB
Specific Gravity, Urine: 1.008 (ref 1.005–1.030)
pH: 6 (ref 5.0–8.0)

## 2024-04-03 LAB — CBC
HCT: 42 % (ref 39.0–52.0)
Hemoglobin: 14 g/dL (ref 13.0–17.0)
MCH: 29.6 pg (ref 26.0–34.0)
MCHC: 33.3 g/dL (ref 30.0–36.0)
MCV: 88.8 fL (ref 80.0–100.0)
Platelets: 247 K/uL (ref 150–400)
RBC: 4.73 MIL/uL (ref 4.22–5.81)
RDW: 13.8 % (ref 11.5–15.5)
WBC: 7.8 K/uL (ref 4.0–10.5)
nRBC: 0 % (ref 0.0–0.2)

## 2024-04-03 LAB — CBG MONITORING, ED: Glucose-Capillary: 93 mg/dL (ref 70–99)

## 2024-04-03 MED ORDER — CALCIUM GLUCONATE-NACL 1-0.675 GM/50ML-% IV SOLN
1.0000 g | Freq: Once | INTRAVENOUS | Status: AC
  Administered 2024-04-03: 1000 mg via INTRAVENOUS
  Filled 2024-04-03: qty 50

## 2024-04-03 MED ORDER — POTASSIUM CHLORIDE CRYS ER 20 MEQ PO TBCR
40.0000 meq | EXTENDED_RELEASE_TABLET | Freq: Once | ORAL | Status: AC
  Administered 2024-04-03: 40 meq via ORAL
  Filled 2024-04-03: qty 2

## 2024-04-03 MED ORDER — AMLODIPINE BESYLATE 10 MG PO TABS: 10.0000 mg | ORAL_TABLET | Freq: Every day | ORAL | 0 refills | Status: DC

## 2024-04-03 MED ORDER — CALCIUM CARBONATE-VITAMIN D 500-200 MG-UNIT PO TABS: 1.0000 | ORAL_TABLET | Freq: Two times a day (BID) | ORAL | 3 refills | Status: AC

## 2024-04-03 MED ORDER — LOSARTAN POTASSIUM 50 MG PO TABS
100.0000 mg | ORAL_TABLET | Freq: Once | ORAL | Status: AC
  Administered 2024-04-03: 100 mg via ORAL
  Filled 2024-04-03: qty 2

## 2024-04-03 MED ORDER — AMLODIPINE BESYLATE 5 MG PO TABS: 10.0000 mg | ORAL_TABLET | Freq: Once | ORAL | Status: DC

## 2024-04-03 MED ORDER — LOSARTAN POTASSIUM 100 MG PO TABS: 100.0000 mg | ORAL_TABLET | Freq: Every day | ORAL | 0 refills | Status: DC

## 2024-04-03 NOTE — ED Provider Notes (Signed)
 [place] EMERGENCY DEPARTMENT AT [facility] Provider Note   CSN: [id number] Arrival date & time: 04/03/24  1541     History  Chief Complaint  Patient presents with   Dizziness   Hypertension    Troy Bird is a 68 y.o. male with PMH as listed below who presents with hypertension.  He is accompanied by his sister who provides collateral information.  She reports that they were at the ear doctor and were unable to be seen secondary to his level of hypertension when he checked in.  It is unclear what hypertensive medications he is taking but it sounds like he is prescribed losartan  and amlodipine  and possibly 1 other medication.  He denies headache, chest pain, shortness of breath, worsening lower extremity edema, oliguria. He states that he has only been taking the amlodipine  10 mg daily and not his losartan  100 mg every day as well, as he is prescribed.  He did endorse some mild dizziness but denies that now and denies any numbness tingling, asymmetric weakness, headache, visual changes, chest pain, shortness of breath, hematuria, flank pain.   Past Medical History:  Diagnosis Date   Alcohol abuse    Colon polyps    Hx of blood clots    Hyperlipidemia    Hypertension    Phlebitis        Home Medications Prior to Admission medications   Medication Sig Start Date End Date Taking? Authorizing Provider  amLODipine  (NORVASC ) 10 MG tablet Take 1 tablet (10 mg total) by mouth daily. 06/26/23   Geofm Glade PARAS, MD  calcium -vitamin D  (OSCAL WITH D) 500-200 MG-UNIT tablet Take 1 tablet by mouth 2 (two) times daily. 07/08/20   Geofm Glade PARAS, MD  famotidine  (PEPCID ) 40 MG tablet TAKE 1 TABLET (40 MG TOTAL) BY MOUTH DAILY. FOR YOUR RASH/ITCHING 07/29/20   Geofm Glade PARAS, MD  levocetirizine (XYZAL ) 5 MG tablet Take 1 tablet (5 mg total) by mouth every evening. For your rash/itching. 07/29/20   Geofm Glade PARAS, MD  losartan  (COZAAR ) 100 MG tablet Take 1 tablet (100 mg total) by  mouth daily. 06/26/23   Geofm Glade PARAS, MD  methocarbamol  (ROBAXIN ) 500 MG tablet Take 1 tablet (500 mg total) by mouth every 8 (eight) hours as needed for muscle spasms. 06/26/23   Geofm Glade PARAS, MD  pentoxifylline  (TRENTAL ) 400 MG CR tablet Take 1 tablet (400 mg total) by mouth 3 (three) times daily with meals. 07/31/17   Harden Jerona GAILS, MD  pravastatin  (PRAVACHOL ) 40 MG tablet Take 1 tablet (40 mg total) by mouth daily. 07/22/20   Geofm Glade PARAS, MD      Allergies    Lyrica [pregabalin]    Review of Systems   Review of Systems A 10 point review of systems was performed and is negative unless otherwise reported in HPI.  Physical Exam Updated Vital Signs BP (!) 179/114   Pulse 80   Temp 98.1 F (36.7 C) (Oral)   Resp 18   SpO2 98%  Physical Exam General: Normal appearing male, lying in bed.  HEENT: PERRLA, Sclera anicteric, MMM, trachea midline.  Cardiology: RRR, no murmurs/rubs/gallops. BL radial and DP pulses equal bilaterally.  Resp: Normal respiratory rate and effort. CTAB, no wheezes, rhonchi, crackles.  Abd: Soft, non-tender, non-distended. No rebound tenderness or guarding.  GU: Deferred. MSK: No peripheral edema or signs of trauma. Extremities without deformity or TTP. No cyanosis or clubbing. Skin: warm, dry. No rashes or lesions. Back: No CVA  tenderness Neuro: A&Ox4, CNs II-XII grossly intact. MAEs. Sensation grossly intact.  Psych: Normal mood and affect.   ED Results / Procedures / Treatments   Labs (all labs ordered are listed, but only abnormal results are displayed) Labs Reviewed  COMPREHENSIVE METABOLIC PANEL WITH GFR - Abnormal; Notable for the following components:      Result Value   Potassium 3.1 (*)    Creatinine, Ser 1.56 (*)    Calcium  6.1 (*)    Total Protein 8.6 (*)    GFR, Estimated 48 (*)    All other components within normal limits  URINALYSIS, ROUTINE W REFLEX MICROSCOPIC - Abnormal; Notable for the following components:   Color, Urine STRAW  (*)    Hgb urine dipstick SMALL (*)    Protein, ur 100 (*)    All other components within normal limits  CALCIUM , IONIZED - Abnormal; Notable for the following components:   Calcium , Ionized, Serum 3.5 (*)    All other components within normal limits  CBC  MAGNESIUM  CBG MONITORING, ED    EKG EKG Interpretation Date/Time:  Wednesday April 03 2024 21:15:22 EDT Ventricular Rate:  70 PR Interval:  161 QRS Duration:  84 QT Interval:  465 QTC Calculation: 502 R Axis:   -36  Text Interpretation: Sinus rhythm Probable left atrial enlargement Left axis deviation Borderline T abnormalities, inferior leads Minimal ST elevation, anterior leads Prolonged QT interval Confirmed by Darra Chew [id number]) on 04/04/2024 5:19:50 PM  Radiology CT Head Wo Contrast Result Date: 04/03/2024 CLINICAL DATA:  Confusion, hypertension EXAM: CT HEAD WITHOUT CONTRAST TECHNIQUE: Contiguous axial images were obtained from the base of the skull through the vertex without intravenous contrast. RADIATION DOSE REDUCTION: This exam was performed according to the departmental dose-optimization program which includes automated exposure control, adjustment of the mA and/or kV according to patient size and/or use of iterative reconstruction technique. COMPARISON:  None Available. FINDINGS: Brain: No acute intracranial abnormality. Specifically, no hemorrhage, hydrocephalus, mass lesion, acute infarction, or significant intracranial injury. Vascular: No hyperdense vessel or unexpected calcification. Skull: No acute calvarial abnormality. Sinuses/Orbits: No acute findings Other: None IMPRESSION: No acute intracranial abnormality. Electronically Signed   By: Franky Crease M.D.   On: 04/03/2024 19:04    Procedures Procedures    Medications Ordered in ED Medications  calcium  gluconate 1 g/ 50 mL sodium chloride  IVPB (0 mg Intravenous Stopped 04/03/24 2058)  potassium chloride  SA (KLOR-CON  M) CR tablet 40 mEq (40 mEq Oral Given  04/03/24 1955)  losartan  (COZAAR ) tablet 100 mg (100 mg Oral Given 04/03/24 1955)    ED Course/ Medical Decision Making/ A&P                          Medical Decision Making Amount and/or Complexity of Data Reviewed Labs: ordered. Decision-making details documented in ED Course. Radiology:  Decision-making details documented in ED Course.  Risk OTC drugs. Prescription drug management.    This patient presents to the ED for concern of HTN, this involves an extensive number of treatment options, and is a complaint that carries with it a high risk of complications and morbidity.  I considered the following differential and admission for this acute, potentially life threatening condition. Patient is overall well appearing at this time. Blood pressure is >200 mmHg systolic.  MDM:    After conversation with the patient and his family at bedside, it appears that patient did not know he was supposed to be taking losartan .  He was given losartan  here in the emergency department with improvement in his blood pressures in the 200s into the 180s.  Patient remains asymptomatic.  He does have some mild electrolyte derangements as well including mild hypokalemia and some hypocalcemia which was repleted.  CKD consistent with prior.  Had complained of some dizziness and CT head ordered from triage is unremarkable.  He does not have any focal neurodeficits or headache at this time.  Patient does not have any symptoms on history nor signs on physical exam concerning for end organ damage secondary to hypertension.   Specifically, based up the patient's presentation, the patient is at sufficiently low risk for: -ACS given no CP, no SOB, normal cardio-pulmonary exam -SAH/stroke given no hx of acute headache/stroke like sxs and normal neurologic exam.  -end organ renal disease given no hematuria  Patient seems to have been confused about his medications but his family at bedside states he is acting normally.   Patient is extremely hard of hearing but I discussed extensively with patient about the correct way to take his medications.  Seems that he also had a history of hypocalcemia, unclear exactly why, but had been prescribed calcium  supplementation that he was also not taking.  Encouraged patient to continue taking the calcium  supplementations as it does seem that he needs that.  Family at bedside reports understanding as well and will try to help the patient with his medications.  I renewed his prescriptions for amlodipine , losartan , and calcium  vitamin D  supplement.  The patient was counseled regarding the deleterious effects of hypertension and the necessity of follow up with the patient's primary physician to establish a care plan.  Instructed to f/u with PCP within 1 week for reevaluation and lab recheck. Will be discharged with discharge instructions/return precautions.   Clinical Course as of 04/12/24 1545  Wed Apr 03, 2024  1840 Glucose-Capillary: 70 [HN]  1925 CT Head Wo Contrast No acute intracranial abnormality. [HN]  1925 BP(!): 179/114 [HN]  1930 Calcium (!!): 6.1 Will get iCal and Magnesium. Will replete. [HN]  1931 Potassium(!): 3.1 Mild hypokalemia, will replete as well [HN]  1931 Creatinine(!): 1.56 C/w priors [HN]  1948 Magnesium: 2.0 [HN]  2031 BP(!): 163/100 [HN]  2251 BP 182/105 [HN]    Clinical Course User Index [HN] Franklyn Sid SAILOR, MD    Labs: I Ordered, and personally interpreted labs.  The pertinent results include: Those listed above  Additional history obtained from chart review.    Cardiac Monitoring: The patient was maintained on a cardiac monitor.  I personally viewed and interpreted the cardiac monitored which showed an underlying rhythm of: Normal sinus rhythm  Reevaluation: After the interventions noted above, I reevaluated the patient and found that they have :improved  Social Determinants of Health:  lives independently  Disposition:  DC w/  discharge instructions/return precautions. All questions answered to patient's satisfaction.    Co morbidities that complicate the patient evaluation  Past Medical History:  Diagnosis Date   Alcohol abuse    Colon polyps    Hx of blood clots    Hyperlipidemia    Hypertension    Phlebitis      Medicines No orders of the defined types were placed in this encounter.   I have reviewed the patients home medicines and have made adjustments as needed  Problem List / ED Course: Problem List Items Addressed This Visit       Other   Hypocalcemia - Primary   Other Visit Diagnoses  Hypertensive urgency       Relevant Medications   losartan  (COZAAR ) tablet 100 mg (Completed)   amLODipine  (NORVASC ) 10 MG tablet   losartan  (COZAAR ) 100 MG tablet                   This note was created using dictation software, which may contain spelling or grammatical errors.    Franklyn Sid SAILOR, MD 04/12/24 [phone]

## 2024-04-03 NOTE — ED Triage Notes (Signed)
 Pt arrives via POV. Pt reports about 30 mins ago, he began experiencing dizziness and blurred vision in both eyes. Pt denies headache. He is AxOx4. No focal neurological deficits noted during triage.

## 2024-04-03 NOTE — ED Provider Triage Note (Signed)
 Emergency Medicine Provider Triage Evaluation Note  Troy Bird , a 68 y.o. male  was evaluated in triage.  Pt complains of hypertension.  He is accompanied by his sister who provides collateral information.  She reports that they were at the ear doctor and were unable to be seen secondary to his level of hypertension when he checked in.  It is unclear what hypertensive medications he is taking but it sounds like he is prescribed losartan  and amlodipine  and possibly 1 other medication.  He denies headache, chest pain, shortness of breath, worsening lower extremity edema, oliguria.  Review of Systems  Positive: As above Negative: As above  Physical Exam  BP (!) 204/113 (BP Location: Left Arm)   Pulse 82   Temp 98.1 F (36.7 C) (Oral)   Resp 14   SpO2 94%  Gen:   Awake, no distress   Resp:  Normal effort  MSK:   Moves extremities without difficulty  Other:  Alert and oriented to self, place, month but is unable to tell me what year it is despite asking multiple times, patient seems to not understand the question.  No evidence of facial droop.  No sensory deficits.  Medical Decision Making  Medically screening exam initiated at 4:46 PM.  Appropriate orders placed.  Khriz Liddy was informed that the remainder of the evaluation will be completed by another provider, this initial triage assessment does not replace that evaluation, and the importance of remaining in the ED until their evaluation is complete.     Glendia Rocky SAILOR, [place] 04/03/24 1649

## 2024-04-03 NOTE — Discharge Instructions (Addendum)
 Thank you for coming to [facility] Emergency Department. You were seen for high blood pressure. We did an exam, labs, and imaging, and these showed blood pressure that improved after taking losartan  and low calcium  Your calcium  and potassium were repleted in the emergency department.  Please follow up with your primary care provider within 1 week. You will need to have your labs rechecked.  Please take your blood pressure medicines as prescribed, including amlodipine  10 mg once per day and losartan  100 mg once per day. Please also take your calcium  supplementation as prescribed.   Do not hesitate to return to the ED or call 911 if you experience: -Worsening symptoms -Stroke-like symptoms, including slurred speech, confusion, facial droop, weakness on one side or another, numbness/tingling, vision changes, or severe headache -Lightheadedness, passing out -Fevers/chills -Anything else that concerns you

## 2024-04-03 NOTE — Telephone Encounter (Signed)
 A representative from [facility] called regarding the patient. They had concerns because the patient's blood pressure was 200/132 prior to any exam or procedures. The provider at the office is named Alm Camps. They would like for our office to contact the patient.

## 2024-04-03 NOTE — Telephone Encounter (Signed)
 FYI Only or Action Required?: FYI only for provider.  Patient was last seen in primary care on 06/26/2023 by Geofm Glade PARAS, MD. Called Nurse Triage reporting Hypertension. Symptoms began today-high BP reading in doctor's office but high blood pressure has been going on. Interventions attempted: Prescription medications: amlodipine , losartan . Symptoms are: unchanged.  Triage Disposition: See HCP Within 4 Hours (Or PCP Triage)-recommended to the ED for high Blood pressure  Patient/caregiver understands and will follow disposition?: Yes  Copied from CRM [id number]. Topic: Clinical - Red Word Triage >> Apr 03, 2024  3:15 PM Henretta I wrote: Red Word that prompted transfer to Nurse Triage: Patients sister robin jama called in fro patient as he is having really high blood pressure stated it was 200 and something over 170 Reason for Disposition  [1] Systolic BP  >= 200 OR Diastolic >= 120 AND [2] having NO cardiac or neurologic symptoms  Answer Assessment - Initial Assessment Questions 1. BLOOD PRESSURE: What is the blood pressure? Did you take at least two measurements 5 minutes apart?     200/132 2. ONSET: When did you take your blood pressure?     Took during doctor's office 3. HOW: How did you take your blood pressure? (e.g., automatic home BP monitor, visiting nurse)     Blood pressure taken by a nurse in office 4. HISTORY: Do you have a history of high blood pressure?     yes 5. MEDICINES: Are you taking any medicines for blood pressure? Have you missed any doses recently?     Amlodipine  and losartan -patient is a poor historian. 6. OTHER SYMPTOMS: Do you have any symptoms? (e.g., blurred vision, chest pain, difficulty breathing, headache, weakness)     No symptoms.   Patient's sister calling-patient is a poor historian. Unsure if patient is taking both medications for blood pressure.  Protocols used: Blood Pressure - High-A-AH

## 2024-04-04 NOTE — Telephone Encounter (Signed)
 I have sent another message in regards to this same triage to DOD

## 2024-04-04 NOTE — Telephone Encounter (Signed)
**Note De-identified  [place]bright Obfuscation** Please advise 

## 2024-04-06 LAB — CALCIUM, IONIZED: Calcium, Ionized, Serum: 3.5 mg/dL — ABNORMAL LOW (ref 4.5–5.6)

## 2024-04-08 DIAGNOSIS — H6123 Impacted cerumen, bilateral: Secondary | ICD-10-CM | POA: Diagnosis not present

## 2024-04-08 DIAGNOSIS — T161XXA Foreign body in right ear, initial encounter: Secondary | ICD-10-CM | POA: Diagnosis not present

## 2024-04-08 DIAGNOSIS — H903 Sensorineural hearing loss, bilateral: Secondary | ICD-10-CM | POA: Diagnosis not present

## 2024-04-09 ENCOUNTER — Ambulatory Visit: Admitting: Family Medicine

## 2024-04-09 NOTE — Telephone Encounter (Signed)
 I have scheduled patient to been today with stephanie matthew to address his high blood pressure

## 2024-04-09 NOTE — Progress Notes (Deleted)
   Acute Office Visit  Subjective:     Patient ID: Troy Bird, male    DOB: 04/06/1956, 68 y.o.   MRN: [id number]  No chief complaint on file.   HPI Patient is in today for ***  ROS Per HPI      Objective:    There were no vitals taken for this visit.   Physical Exam Vitals and nursing note reviewed.  Constitutional:      General: He is not in acute distress.    Appearance: Normal appearance.  HENT:     Head: Normocephalic and atraumatic.     Right Ear: External ear normal.     Left Ear: External ear normal.     Nose: Nose normal.     Mouth/Throat:     Mouth: Mucous membranes are moist.     Pharynx: Oropharynx is clear.   Eyes:     Extraocular Movements: Extraocular movements intact.    Cardiovascular:     Rate and Rhythm: Normal rate and regular rhythm.     Pulses: Normal pulses.     Heart sounds: Normal heart sounds.  Pulmonary:     Effort: Pulmonary effort is normal. No respiratory distress.     Breath sounds: Normal breath sounds. No wheezing, rhonchi or rales.   Musculoskeletal:        General: Normal range of motion.     Cervical back: Normal range of motion.     Right lower leg: No edema.     Left lower leg: No edema.  Lymphadenopathy:     Cervical: No cervical adenopathy.   Skin:    General: Skin is warm and dry.   Neurological:     General: No focal deficit present.     Mental Status: He is alert and oriented to person, place, and time.   Psychiatric:        Mood and Affect: Mood normal.        Behavior: Behavior normal.   No results found for any visits on 04/09/24.      Assessment & Plan:   There are no diagnoses linked to this encounter.   No orders of the defined types were placed in this encounter.   No follow-ups on file.  Corean LITTIE Ku, FNP

## 2024-04-15 DIAGNOSIS — H5213 Myopia, bilateral: Secondary | ICD-10-CM | POA: Diagnosis not present

## 2024-04-16 ENCOUNTER — Other Ambulatory Visit: Payer: Self-pay | Admitting: Internal Medicine

## 2024-04-16 ENCOUNTER — Ambulatory Visit (INDEPENDENT_AMBULATORY_CARE_PROVIDER_SITE_OTHER): Admitting: Internal Medicine

## 2024-04-16 ENCOUNTER — Encounter: Payer: Self-pay | Admitting: Internal Medicine

## 2024-04-16 DIAGNOSIS — E559 Vitamin D deficiency, unspecified: Secondary | ICD-10-CM | POA: Diagnosis not present

## 2024-04-16 DIAGNOSIS — H524 Presbyopia: Secondary | ICD-10-CM | POA: Diagnosis not present

## 2024-04-16 DIAGNOSIS — Z91199 Patient's noncompliance with other medical treatment and regimen due to unspecified reason: Secondary | ICD-10-CM

## 2024-04-16 DIAGNOSIS — I10 Essential (primary) hypertension: Secondary | ICD-10-CM | POA: Diagnosis not present

## 2024-04-16 DIAGNOSIS — E876 Hypokalemia: Secondary | ICD-10-CM | POA: Diagnosis not present

## 2024-04-16 LAB — COMPREHENSIVE METABOLIC PANEL WITH GFR
ALT: 5 U/L (ref 0–53)
AST: 17 U/L (ref 0–37)
Albumin: 4.3 g/dL (ref 3.5–5.2)
Alkaline Phosphatase: 111 U/L (ref 39–117)
BUN: 17 mg/dL (ref 6–23)
CO2: 31 meq/L (ref 19–32)
Calcium: 6.8 mg/dL — ABNORMAL LOW (ref 8.4–10.5)
Chloride: 101 meq/L (ref 96–112)
Creatinine, Ser: 1.61 mg/dL — ABNORMAL HIGH (ref 0.40–1.50)
GFR: 43.88 mL/min — ABNORMAL LOW (ref >60.00)
Glucose, Bld: 86 mg/dL (ref 70–99)
Potassium: 3.9 meq/L (ref 3.5–5.1)
Sodium: 139 meq/L (ref 135–145)
Total Bilirubin: 0.6 mg/dL (ref 0.2–1.2)
Total Protein: 9.1 g/dL — ABNORMAL HIGH (ref 6.0–8.3)

## 2024-04-16 LAB — VITAMIN D 25 HYDROXY (VIT D DEFICIENCY, FRACTURES): VITD: 15.6 ng/mL — ABNORMAL LOW (ref 30.00–100.00)

## 2024-04-16 MED ORDER — POTASSIUM CHLORIDE CRYS ER 20 MEQ PO TBCR: 20.0000 meq | EXTENDED_RELEASE_TABLET | Freq: Every day | ORAL | 3 refills | Status: AC

## 2024-04-16 MED ORDER — VALSARTAN-HYDROCHLOROTHIAZIDE 320-25 MG PO TABS
1.0000 | ORAL_TABLET | Freq: Every day | ORAL | 3 refills | Status: DC
Start: 2024-04-16 — End: 2024-05-20

## 2024-04-16 NOTE — Progress Notes (Signed)
   Subjective:   Patient ID: Troy Bird, male    DOB: [birth year], 68 y.o.   MRN: [id number]  HPI The patient is a 68 YO man last seen by PCP 2020 seen in this office last fall to get back on BP medications and did not follow up from that. He was in hospital recently and is coming back from that. They had put him back on normal BP meds as he was again out. 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 No new chest pains. Sister is with him and living together currently to help him with his health.   PMH, FMh, social history reviewed and updated  Review of Systems  Constitutional: Negative.   HENT: Negative.    Eyes: Negative.   Respiratory:  Negative for cough, chest tightness and shortness of breath.   Cardiovascular:  Negative for chest pain, palpitations and leg swelling.  Gastrointestinal:  Negative for abdominal distention, abdominal pain, constipation, diarrhea, nausea and vomiting.  Musculoskeletal:  Positive for arthralgias and back pain.  Skin: Negative.   Neurological: Negative.   Psychiatric/Behavioral: Negative.      Objective:  Physical Exam Constitutional:      Appearance: He is well-developed.  HENT:     Head: Normocephalic and atraumatic.  Cardiovascular:     Rate and Rhythm: Normal rate and regular rhythm.  Pulmonary:     Effort: Pulmonary effort is normal. No respiratory distress.     Breath sounds: Normal breath sounds. No wheezing or rales.  Abdominal:     General: Bowel sounds are normal. There is no distension.     Palpations: Abdomen is soft.     Tenderness: There is no abdominal tenderness. There is no rebound.  Musculoskeletal:     Cervical back: Normal range of motion.  Skin:    General: Skin is warm and dry.  Neurological:     Mental Status: He is alert and oriented to person, place, and time.     Coordination: Coordination abnormal.     Vitals:   04/16/24 1032 04/16/24 1035  BP: (!) 180/96  (!) 184/82  Pulse: 84   Temp: 98.4 F (36.9 C)   TempSrc: Oral   SpO2: 100%   Weight: 151 lb (68.5 kg)   Height: 5' 11 (1.803 m)     Assessment & Plan:  Visit time 30 minutes in face to face communication with patient and coordination of care, additional 15 minutes spent in record review, coordination or care, ordering tests, communicating/referring to other healthcare professionals, documenting in medical records all on the same day of the visit for total time 45 minutes spent on the visit.

## 2024-04-16 NOTE — Patient Instructions (Signed)
 We will stop the losartan  since it is not bringing the blood pressure down.  We will start valsartan /hydrochlorothiazide  to take 1 pill daily. Keep taking amlodipine  1 pill daily as well.   Come back in 2-3 weeks to check the blood pressure.

## 2024-04-17 DIAGNOSIS — H903 Sensorineural hearing loss, bilateral: Secondary | ICD-10-CM | POA: Diagnosis not present

## 2024-04-19 ENCOUNTER — Encounter: Payer: Self-pay | Admitting: Internal Medicine

## 2024-04-19 DIAGNOSIS — E876 Hypokalemia: Secondary | ICD-10-CM | POA: Insufficient documentation

## 2024-04-19 LAB — PTH, INTACT AND CALCIUM
Calcium: 6.3 mg/dL — ABNORMAL LOW (ref 8.6–10.3)
PTH: 21 pg/mL (ref 16–77)

## 2024-04-19 NOTE — Assessment & Plan Note (Signed)
 Giving potassium pills to take 20 mEq daily as levels were low in ER and we are starting hydrochlorothiazide  to help with BP they will likely not recover to normal.

## 2024-04-19 NOTE — Assessment & Plan Note (Signed)
 Levels were severely low in the ER starting hydrochlorothiazide  as that will help to raise calcium  levels. Was thought to be related to vitamin D  deficiency in the past. Checking vitamin D  levels as well as ionized calcium  and PTH.

## 2024-04-19 NOTE — Assessment & Plan Note (Signed)
Checking vitamin D levels and adjust as needed.

## 2024-04-19 NOTE — Assessment & Plan Note (Signed)
 Living with sister now and she is helping him to do his medications and make his follow up appointments.

## 2024-04-19 NOTE — Assessment & Plan Note (Signed)
 BP is severely elevated today on losartan  and amlodipine  for at least 2 weeks. We will change to valsartan /hydrochlorothiazide  320/25 and keep amlodipine  10 mg daily. Checking CMP and adjust as needed. Follow up 2-3 weeks.

## 2024-04-25 ENCOUNTER — Ambulatory Visit: Payer: Self-pay | Admitting: Internal Medicine

## 2024-04-25 MED ORDER — VITAMIN D (ERGOCALCIFEROL) 1.25 MG (50000 UNIT) PO CAPS: 50000.0000 [IU] | ORAL_CAPSULE | ORAL | 0 refills | Status: DC

## 2024-04-26 NOTE — Progress Notes (Signed)
 Called patient and voicemail box was full.

## 2024-04-26 NOTE — Progress Notes (Signed)
 I have mailed patient the results

## 2024-05-07 ENCOUNTER — Ambulatory Visit (INDEPENDENT_AMBULATORY_CARE_PROVIDER_SITE_OTHER): Admitting: Internal Medicine

## 2024-05-07 ENCOUNTER — Encounter: Payer: Self-pay | Admitting: Internal Medicine

## 2024-05-07 DIAGNOSIS — I10 Essential (primary) hypertension: Secondary | ICD-10-CM | POA: Diagnosis not present

## 2024-05-07 DIAGNOSIS — E559 Vitamin D deficiency, unspecified: Secondary | ICD-10-CM | POA: Diagnosis not present

## 2024-05-07 DIAGNOSIS — E876 Hypokalemia: Secondary | ICD-10-CM | POA: Diagnosis not present

## 2024-05-07 LAB — COMPREHENSIVE METABOLIC PANEL WITH GFR
ALT: 4 U/L (ref 0–53)
AST: 14 U/L (ref 0–37)
Albumin: 4 g/dL (ref 3.5–5.2)
Alkaline Phosphatase: 100 U/L (ref 39–117)
BUN: 45 mg/dL — ABNORMAL HIGH (ref 6–23)
CO2: 25 meq/L (ref 19–32)
Calcium: 6.5 mg/dL — ABNORMAL LOW (ref 8.4–10.5)
Chloride: 102 meq/L (ref 96–112)
Creatinine, Ser: 2.32 mg/dL — ABNORMAL HIGH (ref 0.40–1.50)
GFR: 28.3 mL/min — ABNORMAL LOW (ref >60.00)
Glucose, Bld: 92 mg/dL (ref 70–99)
Potassium: 4.6 meq/L (ref 3.5–5.1)
Sodium: 137 meq/L (ref 135–145)
Total Bilirubin: 0.4 mg/dL (ref 0.2–1.2)
Total Protein: 8.3 g/dL (ref 6.0–8.3)

## 2024-05-07 NOTE — Assessment & Plan Note (Signed)
 Checking CMP to assess. Hopefully hydrochlorothiazide  will help the low calcium  levels improve some. He did not get message about low vitamin D  and we asked him to start supplementation.

## 2024-05-07 NOTE — Progress Notes (Signed)
   Subjective:   Patient ID: Troy Bird, male    DOB: 1956-08-12, 68 y.o.   MRN: [id number]  HPI The patient is a 68 YO man coming in for BP check. He is having some mild dizziness. Taking amlodipine  and valsartan /hydrochlorothiazide  for about 3 weeks now. Denies chest pains or headaches.   Review of Systems  Constitutional: Negative.   HENT: Negative.    Eyes: Negative.   Respiratory:  Negative for cough, chest tightness and shortness of breath.   Cardiovascular:  Negative for chest pain, palpitations and leg swelling.  Gastrointestinal:  Negative for abdominal distention, abdominal pain, constipation, diarrhea, nausea and vomiting.  Musculoskeletal: Negative.   Skin: Negative.   Neurological: Negative.   Psychiatric/Behavioral: Negative.      Objective:  Physical Exam Constitutional:      Appearance: He is well-developed.  HENT:     Head: Normocephalic and atraumatic.  Cardiovascular:     Rate and Rhythm: Normal rate and regular rhythm.  Pulmonary:     Effort: Pulmonary effort is normal. No respiratory distress.     Breath sounds: Normal breath sounds. No wheezing or rales.  Abdominal:     General: Bowel sounds are normal. There is no distension.     Palpations: Abdomen is soft.     Tenderness: There is no abdominal tenderness. There is no rebound.  Musculoskeletal:     Cervical back: Normal range of motion.  Skin:    General: Skin is warm and dry.  Neurological:     Mental Status: He is alert and oriented to person, place, and time.     Coordination: Coordination normal.     Vitals:   05/07/24 1405  BP: 122/64  Pulse: 86  Temp: 98.1 F (36.7 C)  TempSrc: Oral  SpO2: 98%  Weight: 151 lb (68.5 kg)  Height: 5' 11 (1.803 m)    Assessment & Plan:

## 2024-05-07 NOTE — Assessment & Plan Note (Signed)
 Checking CMP and on valsartan /hydrochlorothiazide  and potassium supplementation 20 meq daily. Adjust as needed.

## 2024-05-07 NOTE — Assessment & Plan Note (Signed)
 BP is at goal on valsartan /hydrochlorothiazide  320/25 and amlodipine  10 mg daily. Checking CMP for stability.

## 2024-05-07 NOTE — Patient Instructions (Addendum)
 We have sent in the vitamin d  pill to the pharmacy to take 1 pill a week for 3 months then stop.  We are checking the labs today.

## 2024-05-07 NOTE — Assessment & Plan Note (Signed)
 Reviewed labs with him and advised to start weekly vitamin d  supplementation to help.

## 2024-05-10 ENCOUNTER — Ambulatory Visit: Payer: Self-pay | Admitting: Internal Medicine

## 2024-05-10 DIAGNOSIS — I10 Essential (primary) hypertension: Secondary | ICD-10-CM

## 2024-05-10 NOTE — Progress Notes (Signed)
 Called patient and informed his sister that he need to give the office a call back to schedule labs

## 2024-05-13 ENCOUNTER — Other Ambulatory Visit (INDEPENDENT_AMBULATORY_CARE_PROVIDER_SITE_OTHER)

## 2024-05-13 DIAGNOSIS — I10 Essential (primary) hypertension: Secondary | ICD-10-CM

## 2024-05-13 LAB — BASIC METABOLIC PANEL WITH GFR
BUN: 38 mg/dL — ABNORMAL HIGH (ref 6–23)
CO2: 25 meq/L (ref 19–32)
Calcium: 6.8 mg/dL — ABNORMAL LOW (ref 8.4–10.5)
Chloride: 102 meq/L (ref 96–112)
Creatinine, Ser: 2.35 mg/dL — ABNORMAL HIGH (ref 0.40–1.50)
GFR: 27.86 mL/min — ABNORMAL LOW (ref >60.00)
Glucose, Bld: 113 mg/dL — ABNORMAL HIGH (ref 70–99)
Potassium: 4.2 meq/L (ref 3.5–5.1)
Sodium: 138 meq/L (ref 135–145)

## 2024-05-20 ENCOUNTER — Other Ambulatory Visit: Payer: Self-pay | Admitting: Internal Medicine

## 2024-05-20 ENCOUNTER — Ambulatory Visit: Payer: Self-pay | Admitting: Internal Medicine

## 2024-05-20 DIAGNOSIS — I10 Essential (primary) hypertension: Secondary | ICD-10-CM

## 2024-05-20 MED ORDER — TRIAMTERENE-HCTZ 37.5-25 MG PO TABS: 1.0000 | ORAL_TABLET | Freq: Every day | ORAL | 3 refills | Status: DC

## 2024-05-24 NOTE — Progress Notes (Signed)
 Called and patient was not at work and was unable to go over results with whom answered

## 2024-05-27 ENCOUNTER — Other Ambulatory Visit (INDEPENDENT_AMBULATORY_CARE_PROVIDER_SITE_OTHER)

## 2024-05-27 ENCOUNTER — Other Ambulatory Visit

## 2024-05-27 DIAGNOSIS — I10 Essential (primary) hypertension: Secondary | ICD-10-CM | POA: Diagnosis not present

## 2024-05-27 LAB — COMPREHENSIVE METABOLIC PANEL WITH GFR
ALT: 7 U/L (ref 0–53)
AST: 16 U/L (ref 0–37)
Albumin: 4.1 g/dL (ref 3.5–5.2)
Alkaline Phosphatase: 91 U/L (ref 39–117)
BUN: 39 mg/dL — ABNORMAL HIGH (ref 6–23)
CO2: 26 meq/L (ref 19–32)
Calcium: 7 mg/dL — ABNORMAL LOW (ref 8.4–10.5)
Chloride: 100 meq/L (ref 96–112)
Creatinine, Ser: 2.4 mg/dL — ABNORMAL HIGH (ref 0.40–1.50)
GFR: 27.16 mL/min — ABNORMAL LOW (ref >60.00)
Glucose, Bld: 108 mg/dL — ABNORMAL HIGH (ref 70–99)
Potassium: 4.2 meq/L (ref 3.5–5.1)
Sodium: 137 meq/L (ref 135–145)
Total Bilirubin: 0.5 mg/dL (ref 0.2–1.2)
Total Protein: 8.2 g/dL (ref 6.0–8.3)

## 2024-05-29 ENCOUNTER — Telehealth: Payer: Self-pay

## 2024-05-30 ENCOUNTER — Telehealth: Payer: Self-pay

## 2024-05-30 NOTE — Telephone Encounter (Signed)
 I have not reviewed his most recent labs yet so once I do will have recommendation on visit versus labs.

## 2024-05-30 NOTE — Telephone Encounter (Signed)
 Copied from CRM [id number]. Topic: Clinical - Prescription Issue >> May 29, 2024  4:18 PM Drema MATSU wrote: Reason for CRM: Ady from [facility] is calling to transfer patient prescriptions over to them amLODipine  (NORVASC ) 10 MG tablet potassium chloride  SA (KLOR-CON  M) 20 MEQ tablet  triamterene -hydrochlorothiazide  (MAXZIDE-25) 37.5-25 MG tablet Fax [phone]

## 2024-05-30 NOTE — Telephone Encounter (Signed)
**Note De-identified  [place]bright Obfuscation** Please advise 

## 2024-05-31 ENCOUNTER — Ambulatory Visit: Payer: Self-pay | Admitting: Internal Medicine

## 2024-05-31 MED ORDER — HYDRALAZINE HCL 25 MG PO TABS: 25.0000 mg | ORAL_TABLET | Freq: Three times a day (TID) | ORAL | 1 refills | Status: DC

## 2024-05-31 NOTE — Telephone Encounter (Signed)
 Results have came back and will be resulted

## 2024-05-31 NOTE — Telephone Encounter (Signed)
 Ok to transfer amlodipine  only. Result note now done and we are stopping triamterene /hydrochlorothiazide 

## 2024-05-31 NOTE — Progress Notes (Signed)
 I have mailed him a letter

## 2024-05-31 NOTE — Progress Notes (Signed)
 He has several different number to contact him with

## 2024-05-31 NOTE — Telephone Encounter (Signed)
 Will call patient and read back results for him

## 2024-05-31 NOTE — Telephone Encounter (Signed)
 Called patient and have sent a letter

## 2024-05-31 NOTE — Progress Notes (Signed)
 Called patient was unable to get a hold of him.

## 2024-06-04 ENCOUNTER — Telehealth: Payer: Self-pay | Admitting: Internal Medicine

## 2024-06-04 NOTE — Telephone Encounter (Signed)
 He does not need INR but does need follow up based on our most recent labs.

## 2024-06-04 NOTE — Telephone Encounter (Signed)
 Unable to lvm I will cancel this appt by the end if the day

## 2024-06-04 NOTE — Telephone Encounter (Signed)
 Pt has been scheduled for a follow-up.

## 2024-06-04 NOTE — Telephone Encounter (Signed)
 Pt is on the lab schedule for an INR 9/4, no orders.  Per Clotilda, she does not work with this pt.  Please enter orders if appropriate

## 2024-06-05 NOTE — Telephone Encounter (Signed)
 Lab appointment has been canceled and office visit has been scheduled with provider

## 2024-06-05 NOTE — Telephone Encounter (Signed)
 Second attempt to contact the patient unable to LVM

## 2024-06-13 ENCOUNTER — Other Ambulatory Visit

## 2024-06-17 ENCOUNTER — Encounter: Payer: Self-pay | Admitting: Internal Medicine

## 2024-06-17 ENCOUNTER — Ambulatory Visit: Admitting: Internal Medicine

## 2024-06-17 VITALS — BP 122/80 | HR 87 | Temp 98.1°F | Ht 71.0 in | Wt 150.0 lb

## 2024-06-17 DIAGNOSIS — N184 Chronic kidney disease, stage 4 (severe): Secondary | ICD-10-CM

## 2024-06-17 DIAGNOSIS — Z23 Encounter for immunization: Secondary | ICD-10-CM | POA: Diagnosis not present

## 2024-06-17 DIAGNOSIS — I10 Essential (primary) hypertension: Secondary | ICD-10-CM

## 2024-06-17 DIAGNOSIS — E559 Vitamin D deficiency, unspecified: Secondary | ICD-10-CM | POA: Diagnosis not present

## 2024-06-17 LAB — CBC
HCT: 34.4 % — ABNORMAL LOW (ref 39.0–52.0)
Hemoglobin: 11.9 g/dL — ABNORMAL LOW (ref 13.0–17.0)
MCHC: 34.7 g/dL (ref 30.0–36.0)
MCV: 87.2 fl (ref 78.0–100.0)
Platelets: 257 K/uL (ref 150.0–400.0)
RBC: 3.95 Mil/uL — ABNORMAL LOW (ref 4.22–5.81)
RDW: 14.2 % (ref 11.5–15.5)
WBC: 7.6 K/uL (ref 4.0–10.5)

## 2024-06-17 LAB — COMPREHENSIVE METABOLIC PANEL WITH GFR
ALT: 5 U/L (ref 0–53)
AST: 14 U/L (ref 0–37)
Albumin: 4.1 g/dL (ref 3.5–5.2)
Alkaline Phosphatase: 101 U/L (ref 39–117)
BUN: 30 mg/dL — ABNORMAL HIGH (ref 6–23)
CO2: 28 meq/L (ref 19–32)
Calcium: 7.6 mg/dL — ABNORMAL LOW (ref 8.4–10.5)
Chloride: 101 meq/L (ref 96–112)
Creatinine, Ser: 2.45 mg/dL — ABNORMAL HIGH (ref 0.40–1.50)
GFR: 26.48 mL/min — ABNORMAL LOW (ref >60.00)
Glucose, Bld: 80 mg/dL (ref 70–99)
Potassium: 4 meq/L (ref 3.5–5.1)
Sodium: 138 meq/L (ref 135–145)
Total Bilirubin: 0.4 mg/dL (ref 0.2–1.2)
Total Protein: 8.5 g/dL — ABNORMAL HIGH (ref 6.0–8.3)

## 2024-06-17 MED ORDER — HYDRALAZINE HCL 25 MG PO TABS: 25.0000 mg | ORAL_TABLET | Freq: Three times a day (TID) | ORAL | 3 refills | Status: AC

## 2024-06-17 MED ORDER — AMLODIPINE BESYLATE 10 MG PO TABS: 10.0000 mg | ORAL_TABLET | Freq: Every day | ORAL | 3 refills | Status: AC

## 2024-06-17 NOTE — Patient Instructions (Addendum)
 VISIT SUMMARY: You came in today for a follow-up on your blood pressure management and kidney function monitoring. Your blood pressure is well-controlled, and you are feeling well overall with no new symptoms.  YOUR PLAN: HYPERTENSION: Your blood pressure is well-controlled at 122/80 mmHg with your current medications. -Continue taking amlodipine  and hydralazine  as prescribed. -Refills for both medications have been sent to your pharmacy.  CHRONIC KIDNEY DISEASE: You have a history of kidney damage related to high blood pressure and medication adjustments. This is why we have changed the blood pressure medicines twice recently to help see if some of this damage is reversible. We are rechecking the levels today. If they are the same we will have you see a kidney specialist to help us  avoid damage long term. If they are improving we may just recheck the labs in 1 month                      Contains text generated by Abridge.                                 Contains text generated by Abridge.                                  Contains text generated by Abridge.

## 2024-06-17 NOTE — Progress Notes (Unsigned)
   Subjective:   Patient ID: Troy Bird, male    DOB: October 04, 1956, 68 y.o.   MRN: [id number]  Discussed the use of AI scribe software for clinical note transcription with the patient, who gave verbal consent to proceed.  History of Present Illness Troy Bird is a 68 year old male with hypertension who presents for follow-up on blood pressure management and kidney function monitoring.  He reports a recent blood pressure reading of 122/80 mmHg. He monitors his blood pressure every morning and notes that he is running low on his medication supply.  He has a history of kidney function changes related to hypertension and medication adjustments. In June and July, blood work indicated a small amount of kidney damage due to high blood pressure. Medication adjustments were made to simplify his regimen and improve blood pressure control, which initially led to a slight increase in kidney damage. Subsequent medication changes did not significantly alter kidney function, leading to the current regimen of amlodipine  and hydralazine .  No new symptoms such as dizziness, lightheadedness, chest pain, or headaches since starting the new medications. He feels well overall.  Review of Systems  Constitutional: Negative.   HENT: Negative.    Eyes: Negative.   Respiratory:  Negative for cough, chest tightness and shortness of breath.   Cardiovascular:  Negative for chest pain, palpitations and leg swelling.  Gastrointestinal:  Negative for abdominal distention, abdominal pain, constipation, diarrhea, nausea and vomiting.  Musculoskeletal: Negative.   Skin: Negative.   Neurological: Negative.   Psychiatric/Behavioral: Negative.      Objective:  Physical Exam Constitutional:      Appearance: He is well-developed.  HENT:     Head: Normocephalic and atraumatic.  Cardiovascular:     Rate and Rhythm: Normal rate and regular rhythm.  Pulmonary:     Effort: Pulmonary effort is normal. No respiratory  distress.     Breath sounds: Normal breath sounds. No wheezing or rales.  Abdominal:     General: Bowel sounds are normal. There is no distension.     Palpations: Abdomen is soft.     Tenderness: There is no abdominal tenderness.  Musculoskeletal:     Cervical back: Normal range of motion.  Skin:    General: Skin is warm and dry.  Neurological:     Mental Status: He is alert and oriented to person, place, and time.     Coordination: Coordination normal.     Vitals:   06/17/24 1417  BP: 122/80  Pulse: 87  Temp: 98.1 F (36.7 C)  TempSrc: Oral  SpO2: 97%  Weight: 150 lb (68 kg)  Height: 5' 11 (1.803 m)   Flu shot given at visit  Assessment and Plan Assessment & Plan Hypertension   Blood pressure is well-controlled at 122/80 mmHg with amlodipine  and hydralazine . No new symptoms are reported. Continue amlodipine  and hydralazine . Send refills for both medications.  Chronic kidney disease   He has chronic kidney disease with previous damage. Recent medication adjustments have been made to stop first ARB then thiazide to see if renal function improves. Given his past non-compliance this could be progression of disease. Kidney function is being monitored to prevent dialysis. Order lab work to assess kidney function. Repeat lab work in a few weeks if there is no improvement. Consider a nephrology referral if function remains impaired.

## 2024-06-19 ENCOUNTER — Ambulatory Visit: Payer: Self-pay | Admitting: Internal Medicine

## 2024-06-19 DIAGNOSIS — N184 Chronic kidney disease, stage 4 (severe): Secondary | ICD-10-CM

## 2024-06-20 DIAGNOSIS — N184 Chronic kidney disease, stage 4 (severe): Secondary | ICD-10-CM | POA: Insufficient documentation

## 2024-06-20 NOTE — Assessment & Plan Note (Signed)
 He had prior CKD stage 3 with recent decline in renal function. He had been non-compliant with regimen for some time prior to decline. We stopped ARB without improvement in function so stopped thiazide. Recheck CMP today. If improvement recheck in 1 months. If no improvement or worsening will refer to nephrology.

## 2024-06-20 NOTE — Assessment & Plan Note (Signed)
 Blood pressure is well-controlled at 122/80 mmHg with amlodipine  and hydralazine . No new symptoms are reported. Continue amlodipine  and hydralazine . Send refills for both medications.

## 2024-06-20 NOTE — Assessment & Plan Note (Signed)
 Will need vitamin D  recheck in October after finishing 3 months of high dose vitamin d  replacement.

## 2024-06-20 NOTE — Assessment & Plan Note (Signed)
 Checking CMP for stability. He has had vitamin D  deficiency and it has been unclear if this is the cause as he has not had good follow up after vitamin d  treatment. We were hoping that thiazide would help raise calcium  levels but given decline in renal function we have stopped that to see if his renal function improves.

## 2024-06-20 NOTE — Progress Notes (Signed)
 Called patient back and went over lab results. Patient verbalized he understood there were no questions or concerns

## 2024-07-05 ENCOUNTER — Ambulatory Visit

## 2024-07-27 ENCOUNTER — Other Ambulatory Visit: Payer: Self-pay | Admitting: Internal Medicine

## 2024-07-27 DIAGNOSIS — Z720 Tobacco use: Secondary | ICD-10-CM | POA: Diagnosis not present

## 2024-07-27 DIAGNOSIS — E876 Hypokalemia: Secondary | ICD-10-CM | POA: Diagnosis not present

## 2024-07-27 DIAGNOSIS — G3184 Mild cognitive impairment, so stated: Secondary | ICD-10-CM | POA: Diagnosis not present

## 2024-07-27 DIAGNOSIS — N189 Chronic kidney disease, unspecified: Secondary | ICD-10-CM | POA: Diagnosis not present

## 2024-07-27 DIAGNOSIS — I129 Hypertensive chronic kidney disease with stage 1 through stage 4 chronic kidney disease, or unspecified chronic kidney disease: Secondary | ICD-10-CM | POA: Diagnosis not present

## 2024-07-30 ENCOUNTER — Ambulatory Visit (INDEPENDENT_AMBULATORY_CARE_PROVIDER_SITE_OTHER)

## 2024-07-30 VITALS — Ht 71.0 in | Wt 165.0 lb

## 2024-07-30 DIAGNOSIS — Z1211 Encounter for screening for malignant neoplasm of colon: Secondary | ICD-10-CM

## 2024-07-30 DIAGNOSIS — Z0000 Encounter for general adult medical examination without abnormal findings: Secondary | ICD-10-CM | POA: Diagnosis not present

## 2024-07-30 NOTE — Progress Notes (Addendum)
 Subjective:   Troy Bird is a 68 y.o. who presents for an Initial Medicare Wellness preventive visit.  As a reminder, Annual Wellness Visits don't include a physical exam, and some assessments may be limited, especially if this visit is performed virtually. We may recommend an in-person follow-up visit with your provider if needed.  Visit Complete: Virtual I connected with  Troy Bird on 07/30/24 by a audio enabled telemedicine application and verified that I am speaking with the correct person using two identifiers.  Patient Location: Home  Provider Location: Office/Clinic  I discussed the limitations of evaluation and management by telemedicine. The patient expressed understanding and agreed to proceed.  Vital Signs: Because this visit was a virtual/telehealth visit, some criteria may be missing or patient reported. Any vitals not documented were not able to be obtained and vitals that have been documented are patient reported.  VideoDeclined- This patient declined Librarian, academic. Therefore the visit was completed with audio only.  Persons Participating in Visit: Patient.  AWV Questionnaire: No: Patient Medicare AWV questionnaire was not completed prior to this visit.  Cardiac Risk Factors include: advanced age (>16men, >76 women);dyslipidemia;hypertension;male gender;smoking/ tobacco exposure     Objective:    Today's Vitals   07/30/24 1239  Weight: 165 lb (74.8 kg)  Height: 5' 11 (1.803 m)   Body mass index is 23.01 kg/m.     07/30/2024   12:39 PM 04/03/2024    4:17 PM 03/23/2021    4:48 PM 07/09/2019    4:13 AM 07/18/2018   11:53 AM 01/17/2018   11:16 AM  Advanced Directives  Does Patient Have a Medical Advance Directive? No No No No No  No   Would patient like information on creating a medical advance directive? No - Patient declined  No - Patient declined   No - Patient declined      Data saved with a previous flowsheet row  definition    Current Medications (verified) Outpatient Encounter Medications as of 07/30/2024  Medication Sig   amLODipine  (NORVASC ) 10 MG tablet Take 1 tablet (10 mg total) by mouth daily.   calcium -vitamin D  (OSCAL WITH D) 500-200 MG-UNIT tablet Take 1 tablet by mouth 2 (two) times daily.   famotidine  (PEPCID ) 40 MG tablet TAKE 1 TABLET (40 MG TOTAL) BY MOUTH DAILY. FOR YOUR RASH/ITCHING   hydrALAZINE  (APRESOLINE ) 25 MG tablet Take 1 tablet (25 mg total) by mouth 3 (three) times daily.   levocetirizine (XYZAL ) 5 MG tablet Take 1 tablet (5 mg total) by mouth every evening. For your rash/itching.   methocarbamol  (ROBAXIN ) 500 MG tablet Take 1 tablet (500 mg total) by mouth every 8 (eight) hours as needed for muscle spasms.   pentoxifylline  (TRENTAL ) 400 MG CR tablet Take 1 tablet (400 mg total) by mouth 3 (three) times daily with meals.   potassium chloride  SA (KLOR-CON  M) 20 MEQ tablet Take 1 tablet (20 mEq total) by mouth daily.   pravastatin  (PRAVACHOL ) 40 MG tablet Take 1 tablet (40 mg total) by mouth daily.   Vitamin D , Ergocalciferol , (DRISDOL ) 1.25 MG (50000 UNIT) CAPS capsule TAKE 1 CAPSULE (50,000 UNITS TOTAL) BY MOUTH EVERY 7 (SEVEN) DAYS   No facility-administered encounter medications on file as of 07/30/2024.    Allergies (verified) Lyrica [pregabalin]   History: Past Medical History:  Diagnosis Date   Alcohol abuse    Colon polyps    Hx of blood clots    Hyperlipidemia    Hypertension  Phlebitis    Past Surgical History:  Procedure Laterality Date   LEG SURGERY Right 2002   TRACHEAL ESOPHAGEAL PUNCTURE REPAIR  2002   Family History  Problem Relation Age of Onset   Arthritis Mother    Hypertension Mother    Alcohol abuse Father    Hypertension Father    Diabetes Father    Heart disease Father    Social History   Socioeconomic History   Marital status: Married    Spouse name: Not on file   Number of children: Not on file   Years of education: Not on  file   Highest education level: Not on file  Occupational History   Not on file  Tobacco Use   Smoking status: Former    Current packs/day: 0.25    Average packs/day: 0.3 packs/day for 57.0 years (14.3 ttl pk-yrs)    Types: Cigarettes   Smokeless tobacco: Never  Vaping Use   Vaping status: Never Used  Substance and Sexual Activity   Alcohol use: No    Alcohol/week: 0.0 standard drinks of alcohol   Drug use: No   Sexual activity: Not Currently  Other Topics Concern   Not on file  Social History Narrative   Not on file   Social Drivers of Health   Financial Resource Strain: Low Risk  (07/30/2024)   Overall Financial Resource Strain (CARDIA)    Difficulty of Paying Living Expenses: Not hard at all  Food Insecurity: No Food Insecurity (07/30/2024)   Hunger Vital Sign    Worried About Running Out of Food in the Last Year: Never true    Ran Out of Food in the Last Year: Never true  Transportation Needs: No Transportation Needs (07/30/2024)   PRAPARE - Administrator, Civil Service (Medical): No    Lack of Transportation (Non-Medical): No  Physical Activity: Inactive (07/30/2024)   Exercise Vital Sign    Days of Exercise per Week: 0 days    Minutes of Exercise per Session: 0 min  Stress: No Stress Concern Present (07/30/2024)   [employer] of Occupational Health - Occupational Stress Questionnaire    Feeling of Stress: Not at all  Social Connections: Moderately Isolated (07/30/2024)   Social Connection and Isolation Panel    Frequency of Communication with Friends and Family: More than three times a week    Frequency of Social Gatherings with Friends and Family: Twice a week    Attends Religious Services: Never    Database administrator or Organizations: No    Attends Engineer, structural: Never    Marital Status: Married    Tobacco Counseling Counseling given: Not Answered    Clinical Intake:  Pre-visit preparation completed: Yes  Pain  : No/denies pain     BMI - recorded: 23.01 Nutritional Status: BMI of 19-24  Normal Nutritional Risks: None Diabetes: No  Lab Results  Component Value Date   HGBA1C 5.4 07/15/2019     How often do you need to have someone help you when you read instructions, pamphlets, or other written materials from your doctor or pharmacy?: 1 - Never  Interpreter Needed?: No  Information entered by :: Verdie Saba, CMA   Activities of Daily Living     07/30/2024   12:41 PM  In your present state of health, do you have any difficulty performing the following activities:  Hearing? 1  Comment wears hearing aids  Vision? 0  Difficulty concentrating or making decisions? 0  Walking  or climbing stairs? 0  Dressing or bathing? 0  Doing errands, shopping? 0  Preparing Food and eating ? N  Using the Toilet? N  In the past six months, have you accidently leaked urine? N  Do you have problems with loss of bowel control? N  Managing your Medications? N  Managing your Finances? N  Housekeeping or managing your Housekeeping? N    Patient Care Team: Rollene Almarie LABOR, MD as PCP - General (Internal Medicine) Rufus Ozell MATSU, MD as Consulting Physician (Internal Medicine)  I have updated your Care Teams any recent Medical Services you may have received from other providers in the past year.     Assessment:   This is a routine wellness examination for Sherrick.  Hearing/Vision screen Hearing Screening - Comments:: Wears hearing aids Vision Screening - Comments:: Wears rx glasses - up to date with routine eye exams    Goals Addressed               This Visit's Progress     Patient Stated (pt-stated)        Patient stated he plans to continue taking meds daily and keep walking       Depression Screen     07/30/2024   12:43 PM 04/16/2024   10:37 AM 06/26/2023    4:51 PM 06/06/2022    1:17 PM 07/22/2020   10:07 AM 07/15/2019   11:07 AM 06/20/2017    3:50 PM  PHQ 2/9 Scores   PHQ - 2 Score 0 0 0 0 0 0 0  PHQ- 9 Score 0 2         Fall Risk     07/30/2024   12:42 PM 06/17/2024    2:23 PM 04/16/2024   10:37 AM 06/26/2023    4:50 PM 06/06/2022    1:17 PM  Fall Risk   Falls in the past year? 0 0 0 0 0  Number falls in past yr: 0 0 0 0 0  Injury with Fall? 0 0 0 0 0  Risk for fall due to : No Fall Risks  No Fall Risks No Fall Risks No Fall Risks  Follow up Falls evaluation completed;Falls prevention discussed Falls evaluation completed Falls evaluation completed Falls evaluation completed Falls evaluation completed      Data saved with a previous flowsheet row definition    MEDICARE RISK AT HOME:  Medicare Risk at Home Any stairs in or around the home?: No If so, are there any without handrails?: No Home free of loose throw rugs in walkways, pet beds, electrical cords, etc?: Yes Adequate lighting in your home to reduce risk of falls?: Yes Life alert?: No Use of a cane, walker or w/c?: No Grab bars in the bathroom?: No Shower chair or bench in shower?: No Elevated toilet seat or a handicapped toilet?: No  TIMED UP AND GO:  Was the test performed?  No  Cognitive Function: 6CIT completed        07/30/2024   12:46 PM  6CIT Screen  What Year? 0 points  What month? 0 points  What time? 0 points  Count back from 20 0 points  Months in reverse 4 points  Repeat phrase 0 points  Total Score 4 points    Immunizations Immunization History  Administered Date(s) Administered   Fluad Trivalent(High Dose 65+) 06/26/2023   INFLUENZA, HIGH DOSE SEASONAL PF 06/17/2024   Influenza,inj,Quad PF,6+ Mos 09/01/2015, 06/20/2017, 07/15/2019   Influenza-Unspecified 06/20/2016   PFIZER(Purple Top)SARS-COV-2  Vaccination 02/27/2020, 05/14/2020    Screening Tests Health Maintenance  Topic Date Due   Hepatitis C Screening  Never done   DTaP/Tdap/Td (1 - Tdap) Never done   Pneumococcal Vaccine: 50+ Years (1 of 2 - PCV) Never done   Colonoscopy  Never done    Zoster Vaccines- Shingrix (1 of 2) Never done   COVID-19 Vaccine (3 - 2025-26 season) 06/10/2024   Medicare Annual Wellness (AWV)  07/30/2025   Influenza Vaccine  Completed   Meningococcal B Vaccine  Aged Out    Health Maintenance Items Addressed:  Referral sent to GI for colonoscopy w/Dr Pyrtle (Dx-Screening Colonoscopy)  Additional Screening:  Vision Screening: Recommended annual ophthalmology exams for early detection of glaucoma and other disorders of the eye. Is the patient up to date with their annual eye exam?  Yes  Who is the provider or what is the name of the office in which the patient attends annual eye exams? Pt is unable to recall the name of Optometrist   Dental Screening: Recommended annual dental exams for proper oral hygiene  Community Resource Referral / Chronic Care Management: CRR required this visit?  No   CCM required this visit?  No   Plan:    I have personally reviewed and noted the following in the patient's chart:   Medical and social history Use of alcohol, tobacco or illicit drugs  Current medications and supplements including opioid prescriptions. Patient is not currently taking opioid prescriptions. Functional ability and status Nutritional status Physical activity Advanced directives List of other physicians Hospitalizations, surgeries, and ER visits in previous 12 months Vitals Screenings to include cognitive, depression, and falls Referrals and appointments  In addition, I have reviewed and discussed with patient certain preventive protocols, quality metrics, and best practice recommendations. A written personalized care plan for preventive services as well as general preventive health recommendations were provided to patient.   Verdie CHRISTELLA Saba, CMA   07/30/2024   After Visit Summary: (Declined) Due to this being a telephonic visit, with patients personalized plan was offered to patient but patient Declined AVS at this time   Notes:  Nothing significant to report at this time.

## 2024-07-30 NOTE — Patient Instructions (Signed)
 [name],  Thank you for taking the time for your Medicare Wellness Visit. I appreciate your continued commitment to your health goals. Please review the care plan we discussed, and feel free to reach out if I can assist you further.  Medicare recommends these wellness visits once per year to help you and your care team stay ahead of potential health issues. These visits are designed to focus on prevention, allowing your provider to concentrate on managing your acute and chronic conditions during your regular appointments.  Please note that Annual Wellness Visits do not include a physical exam. Some assessments may be limited, especially if the visit was conducted virtually. If needed, we may recommend a separate in-person follow-up with your provider.  Ongoing Care Seeing your primary care provider every 3 to 6 months helps us  monitor your health and provide consistent, personalized care.   Referrals If a referral was made during today's visit and you haven't received any updates within two weeks, please contact the referred provider directly to check on the status.  Recommended Screenings:  Health Maintenance  Topic Date Due   Hepatitis C Screening  Never done   DTaP/Tdap/Td vaccine (1 - Tdap) Never done   Pneumococcal Vaccine for age over 47 (1 of 2 - PCV) Never done   Colon Cancer Screening  Never done   Zoster (Shingles) Vaccine (1 of 2) Never done   COVID-19 Vaccine (3 - 2025-26 season) 06/10/2024   Medicare Annual Wellness Visit  07/30/2025   Flu Shot  Completed   Meningitis B Vaccine  Aged Out       07/30/2024   12:39 PM  Advanced Directives  Does Patient Have a Medical Advance Directive? No  Would patient like information on creating a medical advance directive? No - Patient declined   Advance Care Planning is important because it: Ensures you receive medical care that aligns with your values, goals, and preferences. Provides guidance to your family and loved ones,  reducing the emotional burden of decision-making during critical moments.  Vision: Annual vision screenings are recommended for early detection of glaucoma, cataracts, and diabetic retinopathy. These exams can also reveal signs of chronic conditions such as diabetes and high blood pressure.  Dental: Annual dental screenings help detect early signs of oral cancer, gum disease, and other conditions linked to overall health, including heart disease and diabetes.

## 2024-08-01 ENCOUNTER — Telehealth: Payer: Self-pay | Admitting: Internal Medicine

## 2024-08-01 NOTE — Telephone Encounter (Signed)
 I have pre-filled one out and have placed inside her office box

## 2024-08-01 NOTE — Telephone Encounter (Signed)
 Patient dropped off document Handicap Placard, to be filled out by provider. Patient requested to send it back via Call Patient to pick up within 7-days. Document is located in providers tray at front office.Please advise at [phone]

## 2024-08-01 NOTE — Telephone Encounter (Signed)
 This was not in her box.

## 2024-08-07 ENCOUNTER — Telehealth: Payer: Self-pay

## 2024-08-07 NOTE — Telephone Encounter (Signed)
 Forms for Handicap sticker has been signed by provider and pt has been notified to come and pick up. But upon calling patient he stated that he already had it.

## 2024-08-21 DIAGNOSIS — H6123 Impacted cerumen, bilateral: Secondary | ICD-10-CM | POA: Diagnosis not present

## 2024-09-10 DIAGNOSIS — N184 Chronic kidney disease, stage 4 (severe): Secondary | ICD-10-CM | POA: Diagnosis not present

## 2024-09-10 DIAGNOSIS — I129 Hypertensive chronic kidney disease with stage 1 through stage 4 chronic kidney disease, or unspecified chronic kidney disease: Secondary | ICD-10-CM | POA: Diagnosis not present

## 2024-09-10 DIAGNOSIS — D631 Anemia in chronic kidney disease: Secondary | ICD-10-CM | POA: Diagnosis not present

## 2024-09-10 DIAGNOSIS — E559 Vitamin D deficiency, unspecified: Secondary | ICD-10-CM | POA: Diagnosis not present

## 2024-09-10 DIAGNOSIS — R319 Hematuria, unspecified: Secondary | ICD-10-CM | POA: Diagnosis not present

## 2024-09-10 LAB — LAB REPORT - SCANNED: EGFR: 32

## 2024-09-12 ENCOUNTER — Other Ambulatory Visit: Payer: Self-pay | Admitting: Nephrology

## 2024-09-12 DIAGNOSIS — N184 Chronic kidney disease, stage 4 (severe): Secondary | ICD-10-CM

## 2024-09-19 ENCOUNTER — Inpatient Hospital Stay: Admission: RE | Admit: 2024-09-19 | Discharge: 2024-09-19 | Attending: Nephrology | Admitting: Nephrology

## 2024-09-19 DIAGNOSIS — N184 Chronic kidney disease, stage 4 (severe): Secondary | ICD-10-CM

## 2024-10-14 ENCOUNTER — Encounter (HOSPITAL_BASED_OUTPATIENT_CLINIC_OR_DEPARTMENT_OTHER): Attending: Internal Medicine | Admitting: Internal Medicine

## 2024-10-14 DIAGNOSIS — L97818 Non-pressure chronic ulcer of other part of right lower leg with other specified severity: Secondary | ICD-10-CM | POA: Diagnosis not present

## 2024-10-14 DIAGNOSIS — I87331 Chronic venous hypertension (idiopathic) with ulcer and inflammation of right lower extremity: Secondary | ICD-10-CM | POA: Diagnosis present

## 2024-10-14 DIAGNOSIS — I70238 Atherosclerosis of native arteries of right leg with ulceration of other part of lower right leg: Secondary | ICD-10-CM | POA: Diagnosis not present

## 2024-10-14 DIAGNOSIS — H9190 Unspecified hearing loss, unspecified ear: Secondary | ICD-10-CM | POA: Diagnosis not present

## 2024-10-14 DIAGNOSIS — F17200 Nicotine dependence, unspecified, uncomplicated: Secondary | ICD-10-CM | POA: Insufficient documentation

## 2024-10-21 ENCOUNTER — Encounter (HOSPITAL_BASED_OUTPATIENT_CLINIC_OR_DEPARTMENT_OTHER): Admitting: Internal Medicine

## 2024-10-21 DIAGNOSIS — I87331 Chronic venous hypertension (idiopathic) with ulcer and inflammation of right lower extremity: Secondary | ICD-10-CM | POA: Diagnosis not present

## 2024-10-21 DIAGNOSIS — L97818 Non-pressure chronic ulcer of other part of right lower leg with other specified severity: Secondary | ICD-10-CM | POA: Diagnosis not present

## 2024-10-24 ENCOUNTER — Encounter: Payer: Self-pay | Admitting: Gastroenterology

## 2024-10-28 ENCOUNTER — Encounter (HOSPITAL_BASED_OUTPATIENT_CLINIC_OR_DEPARTMENT_OTHER): Admitting: Internal Medicine

## 2024-10-28 DIAGNOSIS — I70238 Atherosclerosis of native arteries of right leg with ulceration of other part of lower right leg: Secondary | ICD-10-CM

## 2024-10-28 DIAGNOSIS — I87331 Chronic venous hypertension (idiopathic) with ulcer and inflammation of right lower extremity: Secondary | ICD-10-CM | POA: Diagnosis not present

## 2024-10-28 DIAGNOSIS — L97818 Non-pressure chronic ulcer of other part of right lower leg with other specified severity: Secondary | ICD-10-CM

## 2024-10-31 ENCOUNTER — Other Ambulatory Visit (HOSPITAL_COMMUNITY): Payer: Self-pay | Admitting: Internal Medicine

## 2024-10-31 ENCOUNTER — Ambulatory Visit (HOSPITAL_COMMUNITY)
Admission: RE | Admit: 2024-10-31 | Discharge: 2024-10-31 | Disposition: A | Discharge status: Home / Self Care | Source: Ambulatory Visit | Attending: Surgery | Admitting: Surgery

## 2024-10-31 ENCOUNTER — Ambulatory Visit (HOSPITAL_BASED_OUTPATIENT_CLINIC_OR_DEPARTMENT_OTHER)
Admission: RE | Admit: 2024-10-31 | Discharge: 2024-10-31 | Disposition: A | Discharge status: Home / Self Care | Source: Ambulatory Visit | Attending: Surgery | Admitting: Surgery

## 2024-10-31 DIAGNOSIS — T148XXA Other injury of unspecified body region, initial encounter: Secondary | ICD-10-CM

## 2024-10-31 DIAGNOSIS — L97918 Non-pressure chronic ulcer of unspecified part of right lower leg with other specified severity: Secondary | ICD-10-CM | POA: Diagnosis not present

## 2024-10-31 DIAGNOSIS — M79604 Pain in right leg: Secondary | ICD-10-CM

## 2024-10-31 DIAGNOSIS — M79605 Pain in left leg: Secondary | ICD-10-CM | POA: Diagnosis not present

## 2024-10-31 LAB — VAS US ABI WITH/WO TBI
Left ABI: 0.75
Right ABI: 0.45

## 2024-11-04 ENCOUNTER — Encounter (HOSPITAL_BASED_OUTPATIENT_CLINIC_OR_DEPARTMENT_OTHER): Admitting: General Surgery

## 2024-11-05 ENCOUNTER — Telehealth: Payer: Self-pay

## 2024-11-05 NOTE — Telephone Encounter (Signed)
 John/Dr.Cunningham,  Please review patient's medication list and advise if patient will need to hold the medication TRENTAL .     John-there is already a red dot beside this patient's PV appt- just checking to make sure his is still clear for [facility] procedure.  Please/thank you Bre, PV RN

## 2024-11-06 NOTE — Telephone Encounter (Signed)
 Will note this information on the PV chart

## 2024-11-11 ENCOUNTER — Encounter (HOSPITAL_BASED_OUTPATIENT_CLINIC_OR_DEPARTMENT_OTHER): Admitting: Internal Medicine

## 2024-11-13 ENCOUNTER — Ambulatory Visit

## 2024-11-13 ENCOUNTER — Other Ambulatory Visit: Payer: Self-pay

## 2024-11-13 ENCOUNTER — Encounter: Payer: Self-pay | Admitting: Gastroenterology

## 2024-11-13 VITALS — Ht 71.0 in | Wt 155.0 lb

## 2024-11-13 DIAGNOSIS — Z1211 Encounter for screening for malignant neoplasm of colon: Secondary | ICD-10-CM

## 2024-11-13 MED ORDER — NA SULFATE-K SULFATE-MG SULF 17.5-3.13-1.6 GM/177ML PO SOLN: 1.0000 | Freq: Once | ORAL | 0 refills | Status: AC

## 2024-11-13 NOTE — Progress Notes (Signed)
 Denies allergies to eggs or soy products. Denies complication of anesthesia or sedation. Denies use of weight loss medication. Denies use of O2.   Emmi instructions given for colonoscopy.   Pre visit was conducted over an hour. The patient wasn't sure about his history or medication. The patients sister Grayce was able to step in and help give information. Grayce states she will help him follow the instructions. I instructed the patient to call us  if he had any questions or concerns.

## 2024-11-14 ENCOUNTER — Encounter (HOSPITAL_BASED_OUTPATIENT_CLINIC_OR_DEPARTMENT_OTHER): Admitting: Internal Medicine

## 2024-12-04 ENCOUNTER — Encounter: Admitting: Gastroenterology

## 2024-12-17 ENCOUNTER — Ambulatory Visit: Admitting: Internal Medicine

## 2025-08-06 ENCOUNTER — Ambulatory Visit

## 2025-08-06 ENCOUNTER — Encounter: Admitting: Internal Medicine
